# Patient Record
Sex: Female | Born: 1960 | Race: White | Hispanic: No | Smoking: Former smoker
Health system: Southern US, Community
[De-identification: ages and names within clinical notes are randomized; demographics above are authoritative.]

## PROBLEM LIST (undated history)

## (undated) ENCOUNTER — Emergency Department (HOSPITAL_BASED_OUTPATIENT_CLINIC_OR_DEPARTMENT_OTHER): Admission: EM | Discharge status: Against Medical Advice | Payer: Medicare Other

## (undated) DIAGNOSIS — F329 Major depressive disorder, single episode, unspecified: Secondary | ICD-10-CM

## (undated) DIAGNOSIS — J45909 Unspecified asthma, uncomplicated: Secondary | ICD-10-CM

## (undated) DIAGNOSIS — E119 Type 2 diabetes mellitus without complications: Secondary | ICD-10-CM

## (undated) DIAGNOSIS — J449 Chronic obstructive pulmonary disease, unspecified: Secondary | ICD-10-CM

## (undated) DIAGNOSIS — G894 Chronic pain syndrome: Secondary | ICD-10-CM

## (undated) DIAGNOSIS — F32A Depression, unspecified: Secondary | ICD-10-CM

## (undated) DIAGNOSIS — F1490 Cocaine use, unspecified, uncomplicated: Secondary | ICD-10-CM

## (undated) DIAGNOSIS — F419 Anxiety disorder, unspecified: Secondary | ICD-10-CM

## (undated) DIAGNOSIS — E079 Disorder of thyroid, unspecified: Secondary | ICD-10-CM

## (undated) DIAGNOSIS — I10 Essential (primary) hypertension: Secondary | ICD-10-CM

## (undated) DIAGNOSIS — K219 Gastro-esophageal reflux disease without esophagitis: Secondary | ICD-10-CM

## (undated) HISTORY — PX: KNEE SURGERY: SHX244

## (undated) HISTORY — PX: BACK SURGERY: SHX140

## (undated) HISTORY — PX: OTHER SURGICAL HISTORY: SHX169

## (undated) HISTORY — PX: EYE SURGERY: SHX253

---

## 2013-04-23 ENCOUNTER — Emergency Department (HOSPITAL_BASED_OUTPATIENT_CLINIC_OR_DEPARTMENT_OTHER)
Admission: EM | Admit: 2013-04-23 | Discharge: 2013-04-23 | Disposition: A | Discharge status: Home / Self Care | Payer: Medicare Other | Attending: Emergency Medicine | Admitting: Emergency Medicine

## 2013-04-23 ENCOUNTER — Encounter (HOSPITAL_BASED_OUTPATIENT_CLINIC_OR_DEPARTMENT_OTHER): Payer: Self-pay

## 2013-04-23 ENCOUNTER — Emergency Department (HOSPITAL_BASED_OUTPATIENT_CLINIC_OR_DEPARTMENT_OTHER): Payer: Medicare Other

## 2013-04-23 DIAGNOSIS — I10 Essential (primary) hypertension: Secondary | ICD-10-CM | POA: Insufficient documentation

## 2013-04-23 DIAGNOSIS — E079 Disorder of thyroid, unspecified: Secondary | ICD-10-CM | POA: Insufficient documentation

## 2013-04-23 DIAGNOSIS — J45901 Unspecified asthma with (acute) exacerbation: Secondary | ICD-10-CM | POA: Insufficient documentation

## 2013-04-23 DIAGNOSIS — IMO0002 Reserved for concepts with insufficient information to code with codable children: Secondary | ICD-10-CM | POA: Insufficient documentation

## 2013-04-23 DIAGNOSIS — Z79899 Other long term (current) drug therapy: Secondary | ICD-10-CM | POA: Insufficient documentation

## 2013-04-23 DIAGNOSIS — K219 Gastro-esophageal reflux disease without esophagitis: Secondary | ICD-10-CM | POA: Insufficient documentation

## 2013-04-23 DIAGNOSIS — F411 Generalized anxiety disorder: Secondary | ICD-10-CM | POA: Insufficient documentation

## 2013-04-23 DIAGNOSIS — J4521 Mild intermittent asthma with (acute) exacerbation: Secondary | ICD-10-CM

## 2013-04-23 HISTORY — DX: Essential (primary) hypertension: I10

## 2013-04-23 HISTORY — DX: Unspecified asthma, uncomplicated: J45.909

## 2013-04-23 HISTORY — DX: Gastro-esophageal reflux disease without esophagitis: K21.9

## 2013-04-23 HISTORY — DX: Anxiety disorder, unspecified: F41.9

## 2013-04-23 HISTORY — DX: Disorder of thyroid, unspecified: E07.9

## 2013-04-23 MED ORDER — ALBUTEROL SULFATE (5 MG/ML) 0.5% IN NEBU
5.0000 mg | INHALATION_SOLUTION | Freq: Once | RESPIRATORY_TRACT | Status: AC
  Administered 2013-04-23: 5 mg via RESPIRATORY_TRACT
  Filled 2013-04-23: qty 1

## 2013-04-23 MED ORDER — IPRATROPIUM BROMIDE 0.02 % IN SOLN
0.5000 mg | Freq: Once | RESPIRATORY_TRACT | Status: AC
  Administered 2013-04-23: 0.5 mg via RESPIRATORY_TRACT
  Filled 2013-04-23: qty 2.5

## 2013-04-23 MED ORDER — ALBUTEROL SULFATE (5 MG/ML) 0.5% IN NEBU
INHALATION_SOLUTION | RESPIRATORY_TRACT | Status: AC
  Administered 2013-04-23: 17:00:00
  Administered 2013-04-23: 2.5 mg
  Filled 2013-04-23: qty 1

## 2013-04-23 MED ORDER — PREDNISONE 20 MG PO TABS: 60.0000 mg | ORAL_TABLET | Freq: Every day | ORAL | Status: DC

## 2013-04-23 MED ORDER — PREDNISONE 50 MG PO TABS
60.0000 mg | ORAL_TABLET | Freq: Once | ORAL | Status: AC
  Administered 2013-04-23: 60 mg via ORAL
  Filled 2013-04-23: qty 1

## 2013-04-23 NOTE — ED Provider Notes (Signed)
History    CSN: [id number] Arrival date & time 04/23/13  1449 First MD Initiated Contact with Patient 04/23/13 1529      Chief Complaint  Patient presents with  . Cough   HPI Comments: The patient had a cough for at least the last week. She saw her primary doctor earlier in the week and was given a prescription for azithromycin. She finished that yesterday but does not feel any better. Can use to feel like she has mucus in her chest and can't quite get it up. She has not had any recent fevers.  Patient does have a history of asthma. She has been using her inhalers but that has not helped either. She does not smoke.   She denies leg swelling, sore throat or earache.  Patient is a 52 y.o. female presenting with cough. The history is provided by the patient.  Cough Cough characteristics:  Dry Severity:  Moderate Onset quality:  Gradual Timing:  Constant Chronicity:  New Smoker: no   Context: not sick contacts   Relieved by:  Nothing   Past Medical History  Diagnosis Date  . Hypertension   . Asthma   . GERD (gastroesophageal reflux disease)   . Anxiety   . Thyroid disease     Past Surgical History  Procedure Laterality Date  . Back surgery    . Knee surgery      No family history on file.  History  Substance Use Topics  . Smoking status: Never Smoker   . Smokeless tobacco: Current User    Types: Snuff  . Alcohol Use: No    OB History   Grav Para Term Preterm Abortions TAB SAB Ect Mult Living                  Review of Systems  Respiratory: Positive for cough.   All other systems reviewed and are negative.    Allergies  Sulfa antibiotics  Home Medications   Current Outpatient Rx  Name  Route  Sig  Dispense  Refill  . ALPRAZOLAM PO   Oral   Take by mouth.         Marland Kitchen amLODipine (NORVASC) 5 MG tablet   Oral   Take 5 mg by mouth daily.         . DULOXETINE HCL PO   Oral   Take by mouth.         Marland Kitchen HYDROcodone-acetaminophen (NORCO) 10-325 MG  per tablet   Oral   Take 1 tablet by mouth every 6 (six) hours as needed for pain.         Marland Kitchen LEVOTHYROXINE SODIUM PO   Oral   Take by mouth.         . meloxicam (MOBIC) 7.5 MG tablet   Oral   Take 7.5 mg by mouth daily.         Marland Kitchen METHOCARBAMOL PO   Oral   Take by mouth.         . OMEPRAZOLE PO   Oral   Take by mouth.         . OXYBUTYNIN CHLORIDE PO   Oral   Take by mouth.         . predniSONE (DELTASONE) 20 MG tablet   Oral   Take 3 tablets (60 mg total) by mouth daily.   15 tablet   0   . tiotropium (SPIRIVA) 18 MCG inhalation capsule   Inhalation   Place 18 mcg into inhaler and  inhale daily.         . TRAMADOL HCL PO   Oral   Take by mouth.           BP 155/107  Pulse 101  Temp(Src) 97.7 F (36.5 C) (Oral)  Resp 24  Ht 5' 7.5" (1.715 m)  Wt 304 lb (137.893 kg)  BMI 46.88 kg/m2  SpO2 98%  Physical Exam  Nursing note and vitals reviewed. Constitutional: She appears well-developed and well-nourished. No distress.  HENT:  Head: Normocephalic and atraumatic.  Right Ear: External ear normal.  Left Ear: External ear normal.  Eyes: Conjunctivae are normal. Right eye exhibits no discharge. Left eye exhibits no discharge. No scleral icterus.  Neck: Neck supple. No tracheal deviation present.  Cardiovascular: Normal rate, regular rhythm and intact distal pulses.   Pulmonary/Chest: Effort normal. No stridor. No respiratory distress. She has no decreased breath sounds. She has wheezes. She has no rales.  Abdominal: Soft. Bowel sounds are normal. She exhibits no distension. There is no tenderness. There is no rebound and no guarding.  Musculoskeletal: She exhibits no edema and no tenderness.  Neurological: She is alert. She has normal strength. No sensory deficit. Cranial nerve deficit:  no gross defecits noted. She exhibits normal muscle tone. She displays no seizure activity. Coordination normal.  Skin: Skin is warm and dry. No rash noted.   Psychiatric: She has a normal mood and affect.    ED Course  Procedures (including critical care time) Medications  predniSONE (DELTASONE) tablet 60 mg (60 mg Oral Given 04/23/13 1546)  albuterol (PROVENTIL) (5 MG/ML) 0.5% nebulizer solution 5 mg (5 mg Nebulization Given 04/23/13 1618)  ipratropium (ATROVENT) nebulizer solution 0.5 mg (0.5 mg Nebulization Given 04/23/13 1618)  albuterol (PROVENTIL) (5 MG/ML) 0.5% nebulizer solution (2.5 mg  Given 04/23/13 1630)    Labs Reviewed - No data to display Dg Chest 2 View  04/23/2013   *RADIOLOGY REPORT*  Clinical Data: Shortness of breath  CHEST - 2 VIEW  Comparison: None.  Findings: The heart and pulmonary vascularity are within normal limits.  Old healed rib fractures are noted bilaterally.  The lungs are clear. Postsurgical changes are noted in the left shoulder.  IMPRESSION: No acute abnormality noted.   Original Report Authenticated By: Alcide Clever, M.D.      MDM  The patient was given several albuterol treatments. Given oral dose of steroids. She is feeling better now after treatment. Spector symptoms are related to an asthma exacerbation. Her x-ray does not show evidence of pneumonia.       Celene Kras, MD 04/23/13 [phone]

## 2013-04-23 NOTE — ED Notes (Signed)
Cough, chest congestion x 7 days-was seen by PCP last Thursday-rx zithromax complete yesterday

## 2013-06-19 ENCOUNTER — Emergency Department (HOSPITAL_BASED_OUTPATIENT_CLINIC_OR_DEPARTMENT_OTHER)
Admission: EM | Admit: 2013-06-19 | Discharge: 2013-06-19 | Disposition: A | Discharge status: Home / Self Care | Payer: Medicare Other | Attending: Emergency Medicine | Admitting: Emergency Medicine

## 2013-06-19 ENCOUNTER — Encounter (HOSPITAL_BASED_OUTPATIENT_CLINIC_OR_DEPARTMENT_OTHER): Payer: Self-pay | Admitting: *Deleted

## 2013-06-19 DIAGNOSIS — Z79899 Other long term (current) drug therapy: Secondary | ICD-10-CM | POA: Insufficient documentation

## 2013-06-19 DIAGNOSIS — R52 Pain, unspecified: Secondary | ICD-10-CM | POA: Insufficient documentation

## 2013-06-19 DIAGNOSIS — IMO0002 Reserved for concepts with insufficient information to code with codable children: Secondary | ICD-10-CM | POA: Insufficient documentation

## 2013-06-19 DIAGNOSIS — J45909 Unspecified asthma, uncomplicated: Secondary | ICD-10-CM | POA: Insufficient documentation

## 2013-06-19 DIAGNOSIS — M25569 Pain in unspecified knee: Secondary | ICD-10-CM | POA: Insufficient documentation

## 2013-06-19 DIAGNOSIS — E079 Disorder of thyroid, unspecified: Secondary | ICD-10-CM | POA: Insufficient documentation

## 2013-06-19 DIAGNOSIS — G8929 Other chronic pain: Secondary | ICD-10-CM | POA: Insufficient documentation

## 2013-06-19 DIAGNOSIS — F411 Generalized anxiety disorder: Secondary | ICD-10-CM | POA: Insufficient documentation

## 2013-06-19 DIAGNOSIS — M25519 Pain in unspecified shoulder: Secondary | ICD-10-CM | POA: Insufficient documentation

## 2013-06-19 DIAGNOSIS — K219 Gastro-esophageal reflux disease without esophagitis: Secondary | ICD-10-CM | POA: Insufficient documentation

## 2013-06-19 DIAGNOSIS — I10 Essential (primary) hypertension: Secondary | ICD-10-CM | POA: Insufficient documentation

## 2013-06-19 MED ORDER — HYDROCODONE-ACETAMINOPHEN 10-325 MG PO TABS: 1.0000 | ORAL_TABLET | Freq: Four times a day (QID) | ORAL | Status: DC | PRN

## 2013-06-19 NOTE — ED Notes (Signed)
Pt states that she is out of her pain medication and was unable to get her refill on Monday.

## 2013-06-19 NOTE — ED Provider Notes (Signed)
CSN: [id number]     Arrival date & time 06/19/13  1404 History  This chart was scribed for Glynn Octave, MD by Bennett Scrape, ED Scribe. This patient was seen in room MH08/MH08 and the patient's care was started at 3:26 PM.   Chief Complaint  Patient presents with  . Medication Refill    The history is provided by the patient. No language interpreter was used.    HPI Comments: Martha Schwartz is a 52 y.o. female who presents to the Emergency Department requesting a refill on her 10-325 mg Norco that she takes 6 times daily. She had the prescription last filled on July 28th and states that she ran out 2 days ago. She is unable to get a refill until her new Medicaid card kicks in next month due to changing PCPs. She reports that she has been on the pain medication for the past 3 years for her chronic left knee pain and left shoulder pain. The knee pain is described as a stabbing pain that is worse after prolonged periods of standing and the left shoulder pain is described as throbbing. She admits to having prior surgeries to both the left shoulder and left knee for arthritis, last surgery was in 2007 on the left knee. She any recent falls and denies fevers or emesis.    PCP is Dr. Mayford Knife.    Past Medical History  Diagnosis Date  . Hypertension   . Asthma   . GERD (gastroesophageal reflux disease)   . Anxiety   . Thyroid disease    Past Surgical History  Procedure Laterality Date  . Back surgery    . Knee surgery     History reviewed. No pertinent family history. History  Substance Use Topics  . Smoking status: Never Smoker   . Smokeless tobacco: Current User    Types: Snuff  . Alcohol Use: No   No OB history provided.  Review of Systems  A complete 10 system review of systems was obtained and all systems are negative except as noted in the HPI and PMH.   Allergies  Sulfa antibiotics  Home Medications   Current Outpatient Rx  Name  Route  Sig  Dispense  Refill   . ALPRAZOLAM PO   Oral   Take by mouth.         Marland Kitchen amLODipine (NORVASC) 5 MG tablet   Oral   Take 10 mg by mouth daily.          . DULOXETINE HCL PO   Oral   Take by mouth.         . hydrochlorothiazide (HYDRODIURIL) 25 MG tablet   Oral   Take 25 mg by mouth daily.         Marland Kitchen HYDROcodone-acetaminophen (NORCO) 10-325 MG per tablet   Oral   Take 1 tablet by mouth every 6 (six) hours as needed for pain.         Marland Kitchen LEVOTHYROXINE SODIUM PO   Oral   Take by mouth.         . meloxicam (MOBIC) 7.5 MG tablet   Oral   Take 7.5 mg by mouth daily.         Marland Kitchen METHOCARBAMOL PO   Oral   Take by mouth.         . OMEPRAZOLE PO   Oral   Take by mouth.         . OXYBUTYNIN CHLORIDE PO   Oral   Take by mouth.         Marland Kitchen  predniSONE (DELTASONE) 20 MG tablet   Oral   Take 3 tablets (60 mg total) by mouth daily.   15 tablet   0   . tiotropium (SPIRIVA) 18 MCG inhalation capsule   Inhalation   Place 18 mcg into inhaler and inhale daily.         . TRAMADOL HCL PO   Oral   Take by mouth.         Marland Kitchen HYDROcodone-acetaminophen (NORCO) 10-325 MG per tablet   Oral   Take 1 tablet by mouth every 6 (six) hours as needed for pain.   20 tablet   0    BP 126/84  Pulse 76  Temp(Src) 98.1 F (36.7 C) (Oral)  Resp 16  SpO2 96%  LMP 12/10/2012  Physical Exam  Nursing note and vitals reviewed. Constitutional: She is oriented to person, place, and time. She appears well-developed and well-nourished. No distress.  HENT:  Head: Normocephalic and atraumatic.  Mouth/Throat: Oropharynx is clear and moist.  Eyes: Conjunctivae and EOM are normal. Pupils are equal, round, and reactive to light.  Neck: Neck supple. No tracheal deviation present.  Cardiovascular: Normal rate and regular rhythm.   Pulmonary/Chest: Effort normal and breath sounds normal. No respiratory distress.  Abdominal: Soft. There is no tenderness.  Musculoskeletal: Normal range of motion. She  exhibits no edema.  No pain with ROM of the left knee, no effusion, able to flex and extend the left knee, intact DP and PT pulse, no pain with ROM of the left shoulder, no effusion, no tenderness to palpation of the shoulder, intact radial pulse   Neurological: She is alert and oriented to person, place, and time.   5/5 strength throughout   Skin: Skin is warm and dry.  Psychiatric: She has a normal mood and affect. Her behavior is normal.    ED Course   DIAGNOSTIC STUDIES: None performed   COORDINATION OF CARE: 3:27 PM-Informed pt that the ED is not an appropriate place for medications to be refilled. Informed pt that she will not receive any narcotics before discharge due to her driving herself. Discussed discharge plan with pt and pt agreed to plan. Also advised pt to follow up with PCP and pt agreed.   Procedures (including critical care time)  Labs Reviewed - No data to display No results found. 1. Chronic pain     MDM  Acute on chronic knee and shoulder pain.  Hx arthritis, pain for years.  No change in chronic characterization. No weakness, numbness, tingling. No fever or vomiting.   90 norco filled on 06/03/13 by Dr. Mayford Knife.  Patient states she cannot refill this until she gets her new medicaid card.   Explained to the patient that the ER is not inappropriate venue for her chronic pain needs. There is no evidence of septic joint or other orthopedic emergency on exam today. She is given a short course of pain medications until she can see her doctor.  I personally performed the services described in this documentation, which was scribed in my presence. The recorded information has been reviewed and is accurate.    Glynn Octave, MD 06/19/13 [phone]

## 2013-06-19 NOTE — ED Notes (Signed)
MD at bedside. 

## 2013-06-21 ENCOUNTER — Encounter: Payer: Self-pay | Admitting: Internal Medicine

## 2013-06-21 ENCOUNTER — Ambulatory Visit (INDEPENDENT_AMBULATORY_CARE_PROVIDER_SITE_OTHER): Payer: Medicare Other | Admitting: Internal Medicine

## 2013-06-21 VITALS — BP 132/76 | HR 95 | Ht 67.0 in | Wt 309.8 lb

## 2013-06-21 DIAGNOSIS — R000 Tachycardia, unspecified: Secondary | ICD-10-CM

## 2013-06-21 DIAGNOSIS — R0989 Other specified symptoms and signs involving the circulatory and respiratory systems: Secondary | ICD-10-CM

## 2013-06-21 DIAGNOSIS — R0609 Other forms of dyspnea: Secondary | ICD-10-CM

## 2013-06-21 DIAGNOSIS — G8929 Other chronic pain: Secondary | ICD-10-CM

## 2013-06-21 DIAGNOSIS — K219 Gastro-esophageal reflux disease without esophagitis: Secondary | ICD-10-CM

## 2013-06-21 DIAGNOSIS — I10 Essential (primary) hypertension: Secondary | ICD-10-CM

## 2013-06-21 DIAGNOSIS — F1011 Alcohol abuse, in remission: Secondary | ICD-10-CM

## 2013-06-21 DIAGNOSIS — F1191 Opioid use, unspecified, in remission: Secondary | ICD-10-CM

## 2013-06-21 DIAGNOSIS — F1311 Sedative, hypnotic or anxiolytic abuse, in remission: Secondary | ICD-10-CM

## 2013-06-21 DIAGNOSIS — Z87898 Personal history of other specified conditions: Secondary | ICD-10-CM

## 2013-06-21 DIAGNOSIS — E039 Hypothyroidism, unspecified: Secondary | ICD-10-CM

## 2013-06-21 DIAGNOSIS — G4710 Hypersomnia, unspecified: Secondary | ICD-10-CM

## 2013-06-21 MED ORDER — METOPROLOL SUCCINATE ER 25 MG PO TB24: 25.0000 mg | ORAL_TABLET | Freq: Every day | ORAL | Status: DC

## 2013-06-21 NOTE — Patient Instructions (Addendum)
Your physician has recommended that you have a sleep study. This test records several body functions during sleep, including: brain activity, eye movement, oxygen and carbon dioxide blood levels, heart rate and rhythm, breathing rate and rhythm, the flow of air through your mouth and nose, snoring, body muscle movements, and chest and belly movement.  Your physician has recommended you make the following change in your medication: START taking Toprol XL 25mg  daily.   Your physician wants you to follow-up in: 4-6 weeks. You will receive a reminder letter in the mail two months in advance. If you don't receive a letter, please call our office to schedule the follow-up appointment.

## 2013-06-22 ENCOUNTER — Encounter: Payer: Self-pay | Admitting: Internal Medicine

## 2013-06-22 DIAGNOSIS — Z87898 Personal history of other specified conditions: Secondary | ICD-10-CM | POA: Insufficient documentation

## 2013-06-22 DIAGNOSIS — I10 Essential (primary) hypertension: Secondary | ICD-10-CM | POA: Insufficient documentation

## 2013-06-22 DIAGNOSIS — E039 Hypothyroidism, unspecified: Secondary | ICD-10-CM | POA: Insufficient documentation

## 2013-06-22 DIAGNOSIS — K219 Gastro-esophageal reflux disease without esophagitis: Secondary | ICD-10-CM | POA: Insufficient documentation

## 2013-06-22 DIAGNOSIS — G8929 Other chronic pain: Secondary | ICD-10-CM | POA: Insufficient documentation

## 2013-06-22 DIAGNOSIS — R000 Tachycardia, unspecified: Secondary | ICD-10-CM | POA: Insufficient documentation

## 2013-06-22 DIAGNOSIS — F1191 Opioid use, unspecified, in remission: Secondary | ICD-10-CM | POA: Insufficient documentation

## 2013-06-22 DIAGNOSIS — F1011 Alcohol abuse, in remission: Secondary | ICD-10-CM | POA: Insufficient documentation

## 2013-06-22 NOTE — Progress Notes (Signed)
OFFICE NOTE  Chief Complaint:  Tachycardia, hypertension  Primary Care Physician: Martha Liner, MD  HPI:  Martha Schwartz is a 52 year old female with numerous medical problems including morbid obesity, history of alcohol abuse in the past and long-standing narcotic medication use for chronic pain including back pain knee pain and joint pain. She was recently seen in Dr. Mayford Schwartz office was found to be hypertensive. He increased her medications and she does have notable tachycardia. She is referred for evaluation of her tachycardia. Reports her heart rate is always fairly fast, mostly when she is in pain. Denies any chest pain really get short of breath only with marked exertion. Other than hypertension seen on any other significant coronary risk factors besides morbid obesity. She takes Cymbalta for chronic pain and unknown mood disorder.  PMHx:  Past Medical History  Diagnosis Date  . Hypertension   . Asthma   . GERD (gastroesophageal reflux disease)   . Anxiety   . Thyroid disease     Past Surgical History  Procedure Laterality Date  . Back surgery    . Knee surgery      FAMHx:  No family history on file.  SOCHx:   reports that she has quit smoking. Her smoking use included Cigarettes. She smoked 0.00 packs per day. Her smokeless tobacco use includes Snuff. She reports that she does not drink alcohol or use illicit drugs.  ALLERGIES:  Allergies  Allergen Reactions  . Sulfa Antibiotics Rash    ROS: A comprehensive review of systems was negative except for: Constitutional: positive for fatigue Respiratory: positive for dyspnea on exertion Cardiovascular: positive for htn Musculoskeletal: positive for back pain and myalgias Behavioral/Psych: positive for anxiety  HOME MEDS: Current Outpatient Prescriptions  Medication Sig Dispense Refill  . ALBUTEROL IN Inhale into the lungs as needed.      . ALPRAZOLAM PO Take 1 mg by mouth 2 (two) times daily.       Marland Kitchen  amLODipine (NORVASC) 5 MG tablet Take 10 mg by mouth daily.       Marland Kitchen Dextromethorphan-Guaifenesin (GUAIFENESIN DM) 400-20 MG TABS Take by mouth as needed.      . docusate sodium (COLACE) 100 MG capsule Take 100 mg by mouth 2 (two) times daily.      . DULOXETINE HCL PO Take 120 mg by mouth daily.       Marland Kitchen HYDROcodone-acetaminophen (NORCO) 10-325 MG per tablet Take 1 tablet by mouth every 6 (six) hours as needed for pain.  20 tablet  0  . hydrocortisone 2.5 % cream Apply 1 application topically daily.      Marland Kitchen ibuprofen (ADVIL,MOTRIN) 200 MG tablet Take 200 mg by mouth every 6 (six) hours as needed for pain.      Marland Kitchen LEVOTHYROXINE SODIUM PO Take 50 mg by mouth daily.       Marland Kitchen lisinopril-hydrochlorothiazide (PRINZIDE,ZESTORETIC) 20-25 MG per tablet Take 1 tablet by mouth daily.      . meloxicam (MOBIC) 7.5 MG tablet Take 7.5 mg by mouth daily.      Marland Kitchen METHOCARBAMOL PO Take 750-1,500 mg by mouth 2 (two) times daily.       Marland Kitchen OMEPRAZOLE PO Take 20 mg by mouth daily.       . OXYBUTYNIN CHLORIDE PO Take 5 mg by mouth.       . tiotropium (SPIRIVA) 18 MCG inhalation capsule Place 18 mcg into inhaler and inhale daily.      Marland Kitchen tobramycin-dexamethasone (TOBRADEX) ophthalmic solution Place 1 drop into  both eyes every 4 (four) hours while awake.      Marland Kitchen TRAMADOL HCL PO Take 50 mg by mouth every 8 (eight) hours.       . metoprolol succinate (TOPROL XL) 25 MG 24 hr tablet Take 1 tablet (25 mg total) by mouth daily.  30 tablet  11   No current facility-administered medications for this visit.    LABS/IMAGING: No results found for this or any previous visit (from the past 48 hour(s)). No results found.  VITALS: BP 132/76  Pulse 95  Ht 5\' 7"  (1.702 m)  Wt 309 lb 12.8 oz (140.524 kg)  BMI 48.51 kg/m2  LMP 12/10/2012  EXAM: General appearance: alert and no distress Neck: no adenopathy, no carotid bruit, no JVD, supple, symmetrical, trachea midline and thyroid not enlarged, symmetric, no  tenderness/mass/nodules Lungs: clear to auscultation bilaterally Heart: regular rate and rhythm, S1, S2 normal, no murmur, click, rub or gallop Abdomen: soft, non-tender; bowel sounds normal; no masses,  no organomegaly Extremities: extremities normal, atraumatic, no cyanosis or edema Pulses: 2+ and symmetric Skin: Skin color, texture, turgor normal. No rashes or lesions Neurologic: Grossly normal  EKG: Normal sinus rhythm at 95  ASSESSMENT: 1. Hypertension-controlled 2. Tachycardia 3. Morbid obesity 4. Chronic pain probably contributing to some of her tachycardia as well as her obesity  PLAN: 1.   Martha Schwartz has better blood pressure control on her current regimen. I think she would benefit from a beta blocker to help with her tachycardia and to help with her shortness of breath with exertion. She would also benefit from exercise and weight loss and so those are mainly contributing to most of her symptoms. On his in this may be difficult to do due to her chronic pain and narcotic use. She's not had worsening symptoms of angina at this time and has some risk factors but nothing particularly high risk. I don't feel that any stress testing is necessary at this time. We'll simply see her she does on her new beta blocker.  Martha Nose, MD, [facility] Attending Cardiologist [facility]  Martha Schwartz 06/22/2013, 2:35 PM

## 2013-06-27 ENCOUNTER — Telehealth: Payer: Self-pay | Admitting: Internal Medicine

## 2013-06-27 NOTE — Telephone Encounter (Signed)
Patient saw Dr Rennis Golden the other day and was given Metoprolol lER Succ 25mg .  Can she take this at bedtime---this medication makes her sleepy.

## 2013-06-27 NOTE — Telephone Encounter (Signed)
Returned call.  Pt informed message received and that she can try taking metoprolol at night for a few days to see if that helps.  Advised she call back if no change.  Pt verbalized understanding and agreed w/ plan.

## 2013-07-29 ENCOUNTER — Ambulatory Visit (HOSPITAL_BASED_OUTPATIENT_CLINIC_OR_DEPARTMENT_OTHER): Payer: Medicare Other | Attending: Internal Medicine | Admitting: Radiology

## 2013-07-29 VITALS — Ht 67.0 in | Wt 309.0 lb

## 2013-07-29 DIAGNOSIS — G4710 Hypersomnia, unspecified: Secondary | ICD-10-CM

## 2013-07-29 DIAGNOSIS — R0989 Other specified symptoms and signs involving the circulatory and respiratory systems: Secondary | ICD-10-CM

## 2013-07-29 DIAGNOSIS — G4700 Insomnia, unspecified: Secondary | ICD-10-CM | POA: Insufficient documentation

## 2013-07-29 DIAGNOSIS — R0609 Other forms of dyspnea: Secondary | ICD-10-CM

## 2013-07-29 DIAGNOSIS — G4733 Obstructive sleep apnea (adult) (pediatric): Secondary | ICD-10-CM

## 2013-08-04 DIAGNOSIS — G4733 Obstructive sleep apnea (adult) (pediatric): Secondary | ICD-10-CM

## 2013-08-05 ENCOUNTER — Ambulatory Visit: Payer: Medicare Other | Admitting: Internal Medicine

## 2013-08-05 NOTE — Procedures (Signed)
NAMECECILEE, Martha Schwartz               ACCOUNT NO.:  [id number]  MEDICAL RECORD NO.:  [id number]          PATIENT TYPE:  OUT  LOCATION:  SLEEP CENTER                 FACILITY:  [facility]  PHYSICIAN:  Elexius Minar D. Maple Hudson, MD, FCCP, FACPDATE OF BIRTH:  1962-[birth year]-20  DATE OF STUDY:  07/29/2013                           NOCTURNAL POLYSOMNOGRAM  REFERRING PHYSICIAN:  Italy Hilty, MD  INDICATION FOR STUDY:  Insomnia with sleep apnea.  EPWORTH SLEEPINESS SCORE:  7/24.  BMI 48, weight 309 pounds, height 67 inches, neck 18 inches.  MEDICATIONS:  Home medications are charted for review.  SLEEP ARCHITECTURE:  Total sleep time 308.5 minutes with sleep efficiency 87.1%.  Stage I was 6.8%, stage II 90.8%, stage III 2.4%, REM absent.  Sleep latency 26 minutes.  Awake after sleep onset 17.5 minutes.  Arousal index 15.  BEDTIME MEDICATION:  Methocarbamol, amlodipine.  RESPIRATORY DATA:  Apnea-hypopnea index (AHI) 1.9 per hour.  A total of 10 events was scored, all with hypopneas while sleeping supine.  There were not enough events to apply split protocol CPAP titration.  OXYGEN DATA:  Moderately loud snoring with oxygen desaturation to a nadir of 92% and mean oxygen saturation through the study of 95.6% on room air.  CARDIAC DATA:  Sinus rhythm with frequent PVCs.  MOVEMENT-PARASOMNIA:  No significant movement disturbance.  Bathroom x1.  IMPRESSIONS-RECOMMENDATIONS: 1. Occasional respiratory events with sleep disturbance, within normal     limits.  AHI 1.9 per hour (the normal range for adults is an AHI     between 0 and 5 events per hour).  Moderately loud snoring with     oxygen desaturation to a nadir of 92% and mean oxygen saturation     through the study of 95.6% on room air. 2. The patient attributed her sleep problems to pain causing sleep     disturbance.    Marica Trentham D. Maple Hudson, MD, [facility], FACP Diplomate, American Board of Sleep Medicine   CDY/MEDQ  D:  08/04/2013 12:58:17  T:  08/05/2013  04:02:05  Job:  [id number]

## 2013-08-08 ENCOUNTER — Telehealth: Payer: Self-pay | Admitting: *Deleted

## 2013-08-08 NOTE — Telephone Encounter (Signed)
Called patient with sleep study results per Dr. Rennis Golden (negative for sleep apnea). Patient verbalized understanding.

## 2014-06-14 ENCOUNTER — Other Ambulatory Visit: Payer: Self-pay | Admitting: Internal Medicine

## 2014-06-16 NOTE — Telephone Encounter (Signed)
Rx refill sent to patient pharmacy   

## 2014-10-28 ENCOUNTER — Ambulatory Visit: Payer: Medicare Other

## 2014-11-12 ENCOUNTER — Ambulatory Visit: Payer: Medicare Other | Admitting: Rehabilitation

## 2014-11-18 ENCOUNTER — Ambulatory Visit: Payer: Medicare Other

## 2014-12-01 ENCOUNTER — Ambulatory Visit: Payer: Medicare Other

## 2014-12-09 ENCOUNTER — Ambulatory Visit: Payer: Medicare Other

## 2014-12-15 ENCOUNTER — Ambulatory Visit: Payer: Medicare Other | Attending: Orthopaedic Surgery

## 2015-01-25 ENCOUNTER — Emergency Department (HOSPITAL_BASED_OUTPATIENT_CLINIC_OR_DEPARTMENT_OTHER)
Admission: EM | Admit: 2015-01-25 | Discharge: 2015-01-25 | Disposition: A | Discharge status: Home / Self Care | Payer: Medicare Other | Attending: Emergency Medicine | Admitting: Emergency Medicine

## 2015-01-25 DIAGNOSIS — E079 Disorder of thyroid, unspecified: Secondary | ICD-10-CM | POA: Insufficient documentation

## 2015-01-25 DIAGNOSIS — Z9889 Other specified postprocedural states: Secondary | ICD-10-CM | POA: Insufficient documentation

## 2015-01-25 DIAGNOSIS — M549 Dorsalgia, unspecified: Secondary | ICD-10-CM

## 2015-01-25 DIAGNOSIS — K219 Gastro-esophageal reflux disease without esophagitis: Secondary | ICD-10-CM | POA: Diagnosis not present

## 2015-01-25 DIAGNOSIS — G8929 Other chronic pain: Secondary | ICD-10-CM | POA: Diagnosis not present

## 2015-01-25 DIAGNOSIS — Z791 Long term (current) use of non-steroidal anti-inflammatories (NSAID): Secondary | ICD-10-CM | POA: Insufficient documentation

## 2015-01-25 DIAGNOSIS — Z7952 Long term (current) use of systemic steroids: Secondary | ICD-10-CM | POA: Diagnosis not present

## 2015-01-25 DIAGNOSIS — J45909 Unspecified asthma, uncomplicated: Secondary | ICD-10-CM | POA: Diagnosis not present

## 2015-01-25 DIAGNOSIS — Z79899 Other long term (current) drug therapy: Secondary | ICD-10-CM | POA: Diagnosis not present

## 2015-01-25 DIAGNOSIS — Z87891 Personal history of nicotine dependence: Secondary | ICD-10-CM | POA: Insufficient documentation

## 2015-01-25 DIAGNOSIS — I10 Essential (primary) hypertension: Secondary | ICD-10-CM | POA: Insufficient documentation

## 2015-01-25 DIAGNOSIS — F419 Anxiety disorder, unspecified: Secondary | ICD-10-CM | POA: Diagnosis not present

## 2015-01-25 DIAGNOSIS — M545 Low back pain: Secondary | ICD-10-CM | POA: Diagnosis not present

## 2015-01-25 LAB — CBG MONITORING, ED: GLUCOSE-CAPILLARY: 123 mg/dL — AB (ref 70–99)

## 2015-01-25 MED ORDER — PREDNISONE 50 MG PO TABS
60.0000 mg | ORAL_TABLET | Freq: Once | ORAL | Status: AC
  Administered 2015-01-25: 60 mg via ORAL
  Filled 2015-01-25 (×2): qty 1

## 2015-01-25 MED ORDER — PREDNISONE 10 MG PO TABS: 20.0000 mg | ORAL_TABLET | Freq: Every day | ORAL | Status: DC

## 2015-01-25 MED ORDER — PREDNISONE 10 MG PO TABS: 20.0000 mg | ORAL_TABLET | Freq: Every day | ORAL | Status: AC

## 2015-01-25 NOTE — ED Notes (Addendum)
Pt states back pain, has taken meloxicam and ibuprofen with minimal relief. Pt has hx of discs removed from lower back and has continuous pain. Pt has Oxycodone 15mg  and does not relieve pain.  States she needs an inflammatory shot into back to relieve pain.

## 2015-01-25 NOTE — ED Provider Notes (Signed)
CSN: [id number]     Arrival date & time 01/25/15  1624 History  This chart was scribed for Pattricia Boss, MD by Chester Holstein, ED Scribe. This patient was seen in room MH03/MH03 and the patient's care was started at 6:22 PM.    Chief Complaint  Patient presents with  . Back Pain     Patient is a 54 y.o. female presenting with back pain. The history is provided by the patient. No language interpreter was used.  Back Pain Associated symptoms: no numbness and no weakness    HPI Comments: Martha Schwartz is a 54 y.o. female with PMHx of HTN and DM who presents to the Emergency Department complaining of worsening back pain with onset 3 days ago. Pt with h/o chronic back pain and disc removal and notes flare up.  Pt able to ambulate per baseline. Pt reports "I think I won't be able to walk." Pt denies fall or known injury. Pt took ibuprofen, oxycodone, and meloxicam for relief. Pt states she has gotten an inflammatory shot into back in past. Pt denies any new numbness or weakness and  anyother complaints at this time.   Past Medical History  Diagnosis Date  . Hypertension   . Asthma   . GERD (gastroesophageal reflux disease)   . Anxiety   . Thyroid disease    Past Surgical History  Procedure Laterality Date  . Back surgery    . Knee surgery     No family history on file. History  Substance Use Topics  . Smoking status: Former Smoker    Types: Cigarettes  . Smokeless tobacco: Current User    Types: Snuff  . Alcohol Use: No   OB History    No data available     Review of Systems  Musculoskeletal: Positive for myalgias and back pain. Negative for neck pain.  Neurological: Negative for weakness and numbness.      Allergies  Sulfa antibiotics  Home Medications   Prior to Admission medications   Medication Sig Start Date End Date Taking? Authorizing Provider  ALBUTEROL IN Inhale into the lungs as needed.    Historical Provider, MD  ALPRAZOLAM PO Take 1 mg by mouth 2 (two)  times daily.     Historical Provider, MD  amLODipine (NORVASC) 5 MG tablet Take 10 mg by mouth daily.     Historical Provider, MD  Dextromethorphan-Guaifenesin (GUAIFENESIN DM) 400-20 MG TABS Take by mouth as needed.    Historical Provider, MD  docusate sodium (COLACE) 100 MG capsule Take 100 mg by mouth 2 (two) times daily.    Historical Provider, MD  DULOXETINE HCL PO Take 120 mg by mouth daily.     Historical Provider, MD  HYDROcodone-acetaminophen (NORCO) 10-325 MG per tablet Take 1 tablet by mouth every 6 (six) hours as needed for pain. 06/19/13   Ezequiel Essex, MD  hydrocortisone 2.5 % cream Apply 1 application topically daily.    Historical Provider, MD  ibuprofen (ADVIL,MOTRIN) 200 MG tablet Take 200 mg by mouth every 6 (six) hours as needed for pain.    Historical Provider, MD  LEVOTHYROXINE SODIUM PO Take 50 mg by mouth daily.     Historical Provider, MD  lisinopril-hydrochlorothiazide (PRINZIDE,ZESTORETIC) 20-25 MG per tablet Take 1 tablet by mouth daily.    Historical Provider, MD  meloxicam (MOBIC) 7.5 MG tablet Take 7.5 mg by mouth daily.    Historical Provider, MD  METHOCARBAMOL PO Take 750-1,500 mg by mouth 2 (two) times daily.  Historical Provider, MD  metoprolol succinate (TOPROL-XL) 25 MG 24 hr tablet Take 1 tablet (25 mg total) by mouth daily. *Patient needs appointment for further refills*    Pixie Casino, MD  OMEPRAZOLE PO Take 20 mg by mouth daily.     Historical Provider, MD  OXYBUTYNIN CHLORIDE PO Take 5 mg by mouth.     Historical Provider, MD  tiotropium (SPIRIVA) 18 MCG inhalation capsule Place 18 mcg into inhaler and inhale daily.    Historical Provider, MD  tobramycin-dexamethasone [facility]) ophthalmic solution Place 1 drop into both eyes every 4 (four) hours while awake.    Historical Provider, MD  TRAMADOL HCL PO Take 50 mg by mouth every 8 (eight) hours.     Historical Provider, MD   BP 100/76 mmHg  Pulse 112  Temp(Src) 97.8 F (36.6 C) (Oral)  Resp 24   Ht 5\' 7"  (1.702 m)  Wt 319 lb (144.697 kg)  BMI 49.95 kg/m2  SpO2 97% Physical Exam  Constitutional: She is oriented to person, place, and time. She appears well-developed and well-nourished.  HENT:  Head: Normocephalic.  Eyes: Conjunctivae are normal.  Neck: Normal range of motion. Neck supple.  Cardiovascular: Intact distal pulses.   Pulmonary/Chest: Effort normal.  Musculoskeletal: Normal range of motion.       Cervical back: She exhibits no swelling.       Thoracic back: She exhibits no swelling.       Lumbar back: She exhibits tenderness. She exhibits no swelling.  Neurological: She is alert and oriented to person, place, and time. Coordination and gait normal.  Strength intact and equal bilaterally; Sensation intact; reflexes intact and equal bialterally  Skin: Skin is warm and dry.  Psychiatric: She has a normal mood and affect. Her behavior is normal.  Nursing note and vitals reviewed.   ED Course  Procedures (including critical care time) DIAGNOSTIC STUDIES: Oxygen Saturation is 97% on room air, normal by my interpretation.    COORDINATION OF CARE: 6:40 PM Discussed treatment plan with patient at beside including  Rx for prednisone, the patient agrees with the plan and has no further questions at this time.     MDM   Final diagnoses:  Chronic back pain    I personally performed the services described in this documentation, which was scribed in my presence. The recorded information has been reviewed and considered.   Pattricia Boss, MD 01/28/15 [id number]

## 2015-01-25 NOTE — Discharge Instructions (Signed)

## 2015-02-18 ENCOUNTER — Telehealth: Payer: Self-pay | Admitting: Internal Medicine

## 2015-02-20 NOTE — Telephone Encounter (Signed)
Close encounter 

## 2015-03-05 ENCOUNTER — Ambulatory Visit: Payer: Medicare Other | Admitting: Cardiology

## 2015-03-31 ENCOUNTER — Ambulatory Visit: Payer: Medicare Other | Admitting: Cardiology

## 2015-10-07 ENCOUNTER — Encounter (HOSPITAL_BASED_OUTPATIENT_CLINIC_OR_DEPARTMENT_OTHER): Payer: Self-pay

## 2015-10-07 ENCOUNTER — Inpatient Hospital Stay (HOSPITAL_BASED_OUTPATIENT_CLINIC_OR_DEPARTMENT_OTHER)
Admission: EM | Admit: 2015-10-07 | Discharge: 2015-10-11 | DRG: 760 | Disposition: A | Discharge status: Home / Self Care | Payer: Medicare Other | Attending: Obstetrics & Gynecology | Admitting: Obstetrics & Gynecology

## 2015-10-07 ENCOUNTER — Emergency Department (HOSPITAL_BASED_OUTPATIENT_CLINIC_OR_DEPARTMENT_OTHER): Payer: Medicare Other

## 2015-10-07 DIAGNOSIS — K219 Gastro-esophageal reflux disease without esophagitis: Secondary | ICD-10-CM | POA: Diagnosis present

## 2015-10-07 DIAGNOSIS — N839 Noninflammatory disorder of ovary, fallopian tube and broad ligament, unspecified: Secondary | ICD-10-CM | POA: Diagnosis not present

## 2015-10-07 DIAGNOSIS — N949 Unspecified condition associated with female genital organs and menstrual cycle: Secondary | ICD-10-CM | POA: Diagnosis present

## 2015-10-07 DIAGNOSIS — E119 Type 2 diabetes mellitus without complications: Secondary | ICD-10-CM | POA: Diagnosis present

## 2015-10-07 DIAGNOSIS — R109 Unspecified abdominal pain: Secondary | ICD-10-CM

## 2015-10-07 DIAGNOSIS — N9489 Other specified conditions associated with female genital organs and menstrual cycle: Secondary | ICD-10-CM

## 2015-10-07 DIAGNOSIS — Z720 Tobacco use: Secondary | ICD-10-CM

## 2015-10-07 DIAGNOSIS — I119 Hypertensive heart disease without heart failure: Secondary | ICD-10-CM | POA: Diagnosis present

## 2015-10-07 DIAGNOSIS — G8929 Other chronic pain: Secondary | ICD-10-CM | POA: Diagnosis present

## 2015-10-07 DIAGNOSIS — J45909 Unspecified asthma, uncomplicated: Secondary | ICD-10-CM | POA: Diagnosis present

## 2015-10-07 DIAGNOSIS — R1900 Intra-abdominal and pelvic swelling, mass and lump, unspecified site: Secondary | ICD-10-CM | POA: Diagnosis present

## 2015-10-07 DIAGNOSIS — F1120 Opioid dependence, uncomplicated: Secondary | ICD-10-CM | POA: Diagnosis present

## 2015-10-07 DIAGNOSIS — N83202 Unspecified ovarian cyst, left side: Principal | ICD-10-CM | POA: Diagnosis present

## 2015-10-07 DIAGNOSIS — Z6841 Body Mass Index (BMI) 40.0 and over, adult: Secondary | ICD-10-CM

## 2015-10-07 DIAGNOSIS — E039 Hypothyroidism, unspecified: Secondary | ICD-10-CM | POA: Diagnosis present

## 2015-10-07 DIAGNOSIS — J449 Chronic obstructive pulmonary disease, unspecified: Secondary | ICD-10-CM | POA: Diagnosis present

## 2015-10-07 DIAGNOSIS — R102 Pelvic and perineal pain: Secondary | ICD-10-CM

## 2015-10-07 DIAGNOSIS — N838 Other noninflammatory disorders of ovary, fallopian tube and broad ligament: Secondary | ICD-10-CM | POA: Diagnosis present

## 2015-10-07 HISTORY — DX: Depression, unspecified: F32.A

## 2015-10-07 HISTORY — DX: Type 2 diabetes mellitus without complications: E11.9

## 2015-10-07 HISTORY — DX: Cocaine use, unspecified, uncomplicated: F14.90

## 2015-10-07 HISTORY — DX: Chronic pain syndrome: G89.4

## 2015-10-07 HISTORY — DX: Chronic obstructive pulmonary disease, unspecified: J44.9

## 2015-10-07 HISTORY — DX: Major depressive disorder, single episode, unspecified: F32.9

## 2015-10-07 LAB — DIFFERENTIAL
BASOS ABS: 0.1 10*3/uL (ref 0.0–0.1)
BASOS PCT: 1 %
Eosinophils Absolute: 0.2 10*3/uL (ref 0.0–0.7)
Eosinophils Relative: 2 %
Lymphocytes Relative: 17 %
Lymphs Abs: 1.5 10*3/uL (ref 0.7–4.0)
MONO ABS: 0.6 10*3/uL (ref 0.1–1.0)
Monocytes Relative: 7 %
NEUTROS ABS: 6.6 10*3/uL (ref 1.7–7.7)
Neutrophils Relative %: 73 %

## 2015-10-07 LAB — COMPREHENSIVE METABOLIC PANEL
ALBUMIN: 3.7 g/dL (ref 3.5–5.0)
ALK PHOS: 80 U/L (ref 38–126)
ALT: 33 U/L (ref 14–54)
AST: 36 U/L (ref 15–41)
Anion gap: 9 (ref 5–15)
BILIRUBIN TOTAL: 0.7 mg/dL (ref 0.3–1.2)
BUN: 15 mg/dL (ref 6–20)
CALCIUM: 9.5 mg/dL (ref 8.9–10.3)
CO2: 25 mmol/L (ref 22–32)
CREATININE: 0.75 mg/dL (ref 0.44–1.00)
Chloride: 103 mmol/L (ref 101–111)
GFR calc Af Amer: >60 mL/min (ref >60)
GLUCOSE: 164 mg/dL — AB (ref 65–99)
Potassium: 3.7 mmol/L (ref 3.5–5.1)
Sodium: 137 mmol/L (ref 135–145)
TOTAL PROTEIN: 7.1 g/dL (ref 6.5–8.1)

## 2015-10-07 LAB — CBC
HEMATOCRIT: 38.7 % (ref 36.0–46.0)
Hemoglobin: 12.8 g/dL (ref 12.0–15.0)
MCH: 29.6 pg (ref 26.0–34.0)
MCHC: 33.1 g/dL (ref 30.0–36.0)
MCV: 89.4 fL (ref 78.0–100.0)
PLATELETS: 253 10*3/uL (ref 150–400)
RBC: 4.33 MIL/uL (ref 3.87–5.11)
RDW: 13.5 % (ref 11.5–15.5)
WBC: 8.9 10*3/uL (ref 4.0–10.5)

## 2015-10-07 LAB — I-STAT CG4 LACTIC ACID, ED: Lactic Acid, Venous: 2.12 mmol/L (ref 0.5–2.0)

## 2015-10-07 LAB — PREGNANCY, URINE: PREG TEST UR: NEGATIVE

## 2015-10-07 LAB — URINALYSIS, ROUTINE W REFLEX MICROSCOPIC
Bilirubin Urine: NEGATIVE
GLUCOSE, UA: NEGATIVE mg/dL
Hgb urine dipstick: NEGATIVE
KETONES UR: NEGATIVE mg/dL
Nitrite: NEGATIVE
PROTEIN: NEGATIVE mg/dL
Specific Gravity, Urine: 1.018 (ref 1.005–1.030)
pH: 8 (ref 5.0–8.0)

## 2015-10-07 LAB — URINE MICROSCOPIC-ADD ON

## 2015-10-07 LAB — LIPASE, BLOOD: LIPASE: 21 U/L (ref 11–51)

## 2015-10-07 MED ORDER — HYDROMORPHONE HCL 1 MG/ML IJ SOLN
1.0000 mg | Freq: Once | INTRAMUSCULAR | Status: AC
Start: 2015-10-07 — End: 2015-10-07
  Administered 2015-10-07: 1 mg via INTRAVENOUS
  Filled 2015-10-07: qty 1

## 2015-10-07 MED ORDER — ONDANSETRON HCL 4 MG/2ML IJ SOLN: 4.0000 mg | Freq: Three times a day (TID) | INTRAMUSCULAR | Status: AC | PRN

## 2015-10-07 MED ORDER — HYDROMORPHONE 1 MG/ML IV SOLN
INTRAVENOUS | Status: DC
  Administered 2015-10-07: via INTRAVENOUS
  Administered 2015-10-08: 1.8 mg via INTRAVENOUS
  Administered 2015-10-08: 2.4 mg via INTRAVENOUS
  Administered 2015-10-08 (×2): 1.8 mg via INTRAVENOUS
  Administered 2015-10-08: 6.3 mg via INTRAVENOUS
  Administered 2015-10-09: 2.7 mg via INTRAVENOUS
  Administered 2015-10-09: 3.3 mg via INTRAVENOUS
  Administered 2015-10-09: 1.5 mg via INTRAVENOUS
  Administered 2015-10-09: 1.2 mg via INTRAVENOUS
  Filled 2015-10-07: qty 25

## 2015-10-07 MED ORDER — NALOXONE HCL 0.4 MG/ML IJ SOLN: 0.4000 mg | INTRAMUSCULAR | Status: DC | PRN

## 2015-10-07 MED ORDER — ONDANSETRON HCL 4 MG/2ML IJ SOLN: 4.0000 mg | Freq: Four times a day (QID) | INTRAMUSCULAR | Status: DC | PRN

## 2015-10-07 MED ORDER — MORPHINE SULFATE (PF) 4 MG/ML IV SOLN
8.0000 mg | Freq: Once | INTRAVENOUS | Status: AC
  Administered 2015-10-07: 8 mg via INTRAVENOUS
  Filled 2015-10-07: qty 2

## 2015-10-07 MED ORDER — KETOROLAC TROMETHAMINE 30 MG/ML IJ SOLN
30.0000 mg | Freq: Once | INTRAMUSCULAR | Status: AC
  Administered 2015-10-07: 30 mg via INTRAVENOUS
  Filled 2015-10-07: qty 1

## 2015-10-07 MED ORDER — HYDROMORPHONE HCL 1 MG/ML IJ SOLN
1.0000 mg | Freq: Once | INTRAMUSCULAR | Status: DC
Start: 2015-10-07 — End: 2015-10-07

## 2015-10-07 MED ORDER — DIPHENHYDRAMINE HCL 50 MG/ML IJ SOLN: 12.5000 mg | Freq: Four times a day (QID) | INTRAMUSCULAR | Status: DC | PRN

## 2015-10-07 MED ORDER — PNEUMOCOCCAL VAC POLYVALENT 25 MCG/0.5ML IJ INJ
0.5000 mL | INJECTION | INTRAMUSCULAR | Status: AC
  Administered 2015-10-08: 0.5 mL via INTRAMUSCULAR
  Filled 2015-10-07 (×2): qty 0.5

## 2015-10-07 MED ORDER — IOHEXOL 300 MG/ML  SOLN
100.0000 mL | Freq: Once | INTRAMUSCULAR | Status: AC | PRN
  Administered 2015-10-07: 100 mL via INTRAVENOUS

## 2015-10-07 MED ORDER — DIPHENHYDRAMINE HCL 12.5 MG/5ML PO ELIX
12.5000 mg | ORAL_SOLUTION | Freq: Four times a day (QID) | ORAL | Status: DC | PRN
  Administered 2015-10-09: 12.5 mg via ORAL
  Filled 2015-10-07: qty 5

## 2015-10-07 MED ORDER — SODIUM CHLORIDE 0.9 % IJ SOLN: 9.0000 mL | INTRAMUSCULAR | Status: DC | PRN

## 2015-10-07 MED ORDER — KCL IN DEXTROSE-NACL 20-5-0.45 MEQ/L-%-% IV SOLN
INTRAVENOUS | Status: DC
  Administered 2015-10-07: 50 mL/h via INTRAVENOUS
  Administered 2015-10-08 – 2015-10-10 (×3): via INTRAVENOUS
  Filled 2015-10-07 (×7): qty 1000

## 2015-10-07 MED ORDER — HYDROMORPHONE HCL 1 MG/ML IJ SOLN
1.0000 mg | INTRAMUSCULAR | Status: DC | PRN
  Administered 2015-10-07: 1 mg via INTRAVENOUS
  Filled 2015-10-07: qty 1

## 2015-10-07 MED ORDER — SODIUM CHLORIDE 0.9 % IV BOLUS (SEPSIS)
1000.0000 mL | Freq: Once | INTRAVENOUS | Status: AC
  Administered 2015-10-07: 1000 mL via INTRAVENOUS

## 2015-10-07 MED ORDER — FENTANYL CITRATE (PF) 100 MCG/2ML IJ SOLN
50.0000 ug | INTRAMUSCULAR | Status: DC | PRN
  Administered 2015-10-07: 50 ug via INTRAVENOUS
  Filled 2015-10-07: qty 2

## 2015-10-07 MED ORDER — HYDROMORPHONE HCL 1 MG/ML IJ SOLN
1.0000 mg | Freq: Once | INTRAMUSCULAR | Status: AC
  Administered 2015-10-07: 1 mg via INTRAVENOUS
  Filled 2015-10-07: qty 1

## 2015-10-07 MED ORDER — ONDANSETRON HCL 4 MG/2ML IJ SOLN
4.0000 mg | Freq: Once | INTRAMUSCULAR | Status: AC
  Administered 2015-10-07: 4 mg via INTRAVENOUS
  Filled 2015-10-07: qty 2

## 2015-10-07 MED ORDER — IOHEXOL 300 MG/ML  SOLN
25.0000 mL | Freq: Once | INTRAMUSCULAR | Status: AC | PRN
  Administered 2015-10-07: 25 mL via ORAL

## 2015-10-07 NOTE — H&P (Signed)
Preoperative History and Physical  Martha Schwartz is a 54 y.o. G1P0010 with No LMP recorded. Patient is not currently having periods (Reason: Perimenopausal). admitted for a large left pelvic mass 22 cm with new onset of pain that could not be managed as an outpatient.  Pt had sudden onset of left lower quadrant pain at 1030 this am, was not having any pain prior to that The intensity was severe, as bad as pt has ever had and located in the left lower quadrant radiating to the back  [facility] [place] and CT and subsequent sonogram reveals a cystic mass 22 cm, appears almost all simple cystic to me upon reviewing the images, with no free fluid no ascites no other lesions of the peritoneum or omentum and positive venous and arterial blood flow, ruling out torsion.  Torsion would be highly unlikely given the size of the mass but it could be compression from the mass causing the pain suddenly and of course the mass has been there very likely for quite some time  I was called on 2 occasions and Dr Roselie Awkward also called on pt status.  The physicians were not able to adequately manage her pain so I accepted the patient in transfer for pain management and consideration for operative management of this large mass, all of the characteristics of which point to it being benign.  I discussed with the patient regarding the need for surgical removal and she understands.  Given her weight and the size of the mass I told her it most prudent to wait until tomorrow procedure.  She is under the care of a chronic pain clinic, [facility], in [place] for back knee and neck pain  PMH:    Past Medical History  Diagnosis Date  . Hypertension   . Asthma   . GERD (gastroesophageal reflux disease)   . Anxiety   . Thyroid disease   . Crack cocaine use   . Diabetes mellitus without complication (HCC)     PSH:     Past Surgical History  Procedure Laterality Date  . Back surgery    . Knee  surgery      POb/GynH:      OB History    Gravida Para Term Preterm AB TAB SAB Ectopic Multiple Living   1    1 1           SH:   Social History  Substance Use Topics  . Smoking status: Former Smoker    Types: Cigarettes  . Smokeless tobacco: Current User    Types: Snuff  . Alcohol Use: No    FH:   History reviewed. No pertinent family history.   Allergies:  Allergies  Allergen Reactions  . Sulfa Antibiotics Rash    Medications:       Current facility-administered medications:  .  dextrose 5 % and 0.45 % NaCl with KCl 20 mEq/L infusion, , Intravenous, Continuous, Florian Buff, MD .  ondansetron [facility]) injection 4 mg, 4 mg, Intravenous, Q8H PRN, Veryl Speak, MD .  Derrill Memo ON 10/08/2015] pneumococcal 23 valent vaccine (PNU-IMMUNE) injection 0.5 mL, 0.5 mL, Intramuscular, Tomorrow-1000, Florian Buff, MD  Review of Systems:   Review of Systems  Constitutional: Negative for fever, chills, weight loss, malaise/fatigue and diaphoresis.  HENT: Negative for hearing loss, ear pain, nosebleeds, congestion, sore throat, neck pain, tinnitus and ear discharge.   Eyes: Negative for blurred vision, double vision, photophobia, pain, discharge and redness.  Respiratory:  Negative for cough, hemoptysis, sputum production, shortness of breath, wheezing and stridor.   Cardiovascular: Negative for chest pain, palpitations, orthopnea, claudication, leg swelling and PND.  Gastrointestinal: Positive for abdominal pain. Negative for heartburn, nausea, vomiting, diarrhea, constipation, blood in stool and melena.  Genitourinary: Negative for dysuria, urgency, frequency, hematuria and flank pain.  Musculoskeletal: Negative for myalgias, back pain, joint pain and falls.  Skin: Negative for itching and rash.  Neurological: Negative for dizziness, tingling, tremors, sensory change, speech change, focal weakness, seizures, loss of consciousness, weakness and headaches.  Endo/Heme/Allergies: Negative  for environmental allergies and polydipsia. Does not bruise/bleed easily.  Psychiatric/Behavioral: Negative for depression, suicidal ideas, hallucinations, memory loss and substance abuse. The patient is not nervous/anxious and does not have insomnia.      PHYSICAL EXAM:  Blood pressure 112/62, pulse 103, temperature 97.7 F (36.5 C), temperature source Oral, resp. rate 20, height 5\' 7"  (1.702 m), weight 322 lb 0.2 oz (146.064 kg), SpO2 99 %.    Vitals reviewed. Constitutional: She is oriented to person, place, and time. She appears well-developed and well-nourished.  HENT:  Head: Normocephalic and atraumatic.  Right Ear: External ear normal.  Left Ear: External ear normal.  Nose: Nose normal.  Mouth/Throat: Oropharynx is clear and moist.  Eyes: Conjunctivae and EOM are normal. Pupils are equal, round, and reactive to light. Right eye exhibits no discharge. Left eye exhibits no discharge. No scleral icterus.  Neck: Normal range of motion. Neck supple. No tracheal deviation present. No thyromegaly present.  Cardiovascular: Normal rate, regular rhythm, normal heart sounds and intact distal pulses.  Exam reveals no gallop and no friction rub.   No murmur heard. Respiratory: Effort normal and breath sounds normal. No respiratory distress. She has no wheezes. She has no rales. She exhibits no tenderness.  GI: Soft. Bowel sounds are normal. . There is tenderness, severe in the LLQ. There is no rebound and + guarding.  Genitourinary:       Vulva is normal without lesions Vagina is pink moist without discharge Cervix deferred Uterus is per sonogram Adnexa is per CT and sonogram Musculoskeletal: Normal range of motion. She exhibits no edema and no tenderness.  Neurological: She is alert and oriented to person, place, and time. She has normal reflexes. She displays normal reflexes. No cranial nerve deficit. She exhibits normal muscle tone. Coordination normal.  Skin: Skin is warm and dry. No  rash noted. No erythema. No pallor.  Psychiatric: She has a normal mood and affect. Her behavior is normal. Judgment and thought content normal.    Labs: Results for orders placed or performed during the hospital encounter of 10/07/15 (from the past 336 hour(s))  Urinalysis, Routine w reflex microscopic (not at [facility])   Collection Time: 10/07/15  1:30 PM  Result Value Ref Range   Color, Urine YELLOW YELLOW   APPearance TURBID (A) CLEAR   Specific Gravity, Urine 1.018 1.005 - 1.030   pH 8.0 5.0 - 8.0   Glucose, UA NEGATIVE NEGATIVE mg/dL   Hgb urine dipstick NEGATIVE NEGATIVE   Bilirubin Urine NEGATIVE NEGATIVE   Ketones, ur NEGATIVE NEGATIVE mg/dL   Protein, ur NEGATIVE NEGATIVE mg/dL   Nitrite NEGATIVE NEGATIVE   Leukocytes, UA SMALL (A) NEGATIVE  Pregnancy, urine   Collection Time: 10/07/15  1:30 PM  Result Value Ref Range   Preg Test, Ur NEGATIVE NEGATIVE  Urine microscopic-add on   Collection Time: 10/07/15  1:30 PM  Result Value Ref Range   Squamous Epithelial /  LPF 0-5 (A) NONE SEEN   WBC, UA 0-5 0 - 5 WBC/hpf   RBC / HPF 0-5 0 - 5 RBC/hpf   Bacteria, UA RARE (A) NONE SEEN   Urine-Other AMORPHOUS URATES/PHOSPHATES   Comprehensive metabolic panel   Collection Time: 2015-10-12  2:15 PM  Result Value Ref Range   Sodium 137 135 - 145 mmol/L   Potassium 3.7 3.5 - 5.1 mmol/L   Chloride 103 101 - 111 mmol/L   CO2 25 22 - 32 mmol/L   Glucose, Bld 164 (H) 65 - 99 mg/dL   BUN 15 6 - 20 mg/dL   Creatinine, Ser 0.75 0.44 - 1.00 mg/dL   Calcium 9.5 8.9 - 10.3 mg/dL   Total Protein 7.1 6.5 - 8.1 g/dL   Albumin 3.7 3.5 - 5.0 g/dL   AST 36 15 - 41 U/L   ALT 33 14 - 54 U/L   Alkaline Phosphatase 80 38 - 126 U/L   Total Bilirubin 0.7 0.3 - 1.2 mg/dL   GFR calc non Af Amer >60 >60 mL/min   GFR calc Af Amer >60 >60 mL/min   Anion gap 9 5 - 15  CBC   Collection Time: Oct 12, 2015  2:15 PM  Result Value Ref Range   WBC 8.9 4.0 - 10.5 K/uL   RBC 4.33 3.87 - 5.11 MIL/uL   Hemoglobin  12.8 12.0 - 15.0 g/dL   HCT 38.7 36.0 - 46.0 %   MCV 89.4 78.0 - 100.0 fL   MCH 29.6 26.0 - 34.0 pg   MCHC 33.1 30.0 - 36.0 g/dL   RDW 13.5 11.5 - 15.5 %   Platelets 253 150 - 400 K/uL  Lipase, blood   Collection Time: 2015/10/12  2:15 PM  Result Value Ref Range   Lipase 21 11 - 51 U/L  Differential   Collection Time: 10-12-2015  2:15 PM  Result Value Ref Range   Neutrophils Relative % 73 %   Neutro Abs 6.6 1.7 - 7.7 K/uL   Lymphocytes Relative 17 %   Lymphs Abs 1.5 0.7 - 4.0 K/uL   Monocytes Relative 7 %   Monocytes Absolute 0.6 0.1 - 1.0 K/uL   Eosinophils Relative 2 %   Eosinophils Absolute 0.2 0.0 - 0.7 K/uL   Basophils Relative 1 %   Basophils Absolute 0.1 0.0 - 0.1 K/uL  I-Stat CG4 Lactic Acid, ED   Collection Time: 10/12/2015  2:30 PM  Result Value Ref Range   Lactic Acid, Venous 2.12 (HH) 0.5 - 2.0 mmol/L   Comment NOTIFIED PHYSICIAN     EKG: Orders placed or performed in visit on 06/21/13  . EKG 12-Lead    Imaging Studies: US Transvaginal Non-ob  2015-10-12  CLINICAL DATA:  Sudden onset left adnexal pain. EXAM: TRANSABDOMINAL AND TRANSVAGINAL ULTRASOUND OF PELVIS TECHNIQUE: Both transabdominal and transvaginal ultrasound examinations of the pelvis were performed. Transabdominal technique was performed for global imaging of the pelvis including uterus, ovaries, adnexal regions, and pelvic cul-de-sac. It was necessary to proceed with endovaginal exam following the transabdominal exam to visualize the uterus and ovaries. COMPARISON:  CT abdomen pelvis October 12, 2015 FINDINGS: Uterus Measurements: 7 x 3.4 x 4.1 cm. There is generalized heterogeneity of myometrium. Endometrium Thickness: 8 mm.  The endometrium is heterogeneous. Right ovary Not definitely seen. Left ovary There is a large complex cystic mass in the left adnexa measuring 22 x 18 x 23 cm. Positive color, arterial and venous flow are identified in the soft tissues adjacent to  the large complex mass. Other findings  No free fluid. IMPRESSION: Large complex cystic mass in the left adnexa measuring 22 x 18 x 23 cm. This correlates to the mass seen on recent CT. 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 Normal-sized uterus with generalized heterogeneity of the myometrium. This is nonspecific but can be seen in myomatous infiltration. The endometrium is a heterogeneous and measures 8 mm. If the patient is postmenopausal, the endometrium is abnormally thickened. Electronically Signed   By: Abelardo Diesel M.D.   On: 10/07/2015 18:17   US Pelvis Complete  10/07/2015  CLINICAL DATA:  Sudden onset left adnexal pain. EXAM: TRANSABDOMINAL AND TRANSVAGINAL ULTRASOUND OF PELVIS TECHNIQUE: Both transabdominal and transvaginal ultrasound examinations of the pelvis were performed. Transabdominal technique was performed for global imaging of the pelvis including uterus, ovaries, adnexal regions, and pelvic cul-de-sac. It was necessary to proceed with endovaginal exam following the transabdominal exam to visualize the uterus and ovaries. COMPARISON:  CT abdomen pelvis October 07, 2015 FINDINGS: Uterus Measurements: 7 x 3.4 x 4.1 cm. There is generalized heterogeneity of myometrium. Endometrium Thickness: 8 mm.  The endometrium is heterogeneous. Right ovary Not definitely seen. Left ovary There is a large complex cystic mass in the left adnexa measuring 22 x 18 x 23 cm. Positive color, arterial and venous flow are identified in the soft tissues adjacent to the large complex mass. Other findings No free fluid. IMPRESSION: Large complex cystic mass in the left adnexa measuring 22 x 18 x 23 cm. This correlates to the mass seen on recent CT. 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 Normal-sized uterus with generalized heterogeneity of the myometrium. This is nonspecific but can be seen in myomatous infiltration. The endometrium is a heterogeneous and measures 8 mm. If the patient is postmenopausal, the endometrium is  abnormally thickened. Electronically Signed   By: Abelardo Diesel M.D.   On: 10/07/2015 18:17   Ct Abdomen Pelvis W Contrast  10/07/2015  CLINICAL DATA:  54 year old female with left lower quadrant pain and nausea since this morning. Initial encounter. EXAM: CT ABDOMEN AND PELVIS WITH CONTRAST TECHNIQUE: Multidetector CT imaging of the abdomen and pelvis was performed using the standard protocol following bolus administration of intravenous contrast. CONTRAST:  58mL OMNIPAQUE IOHEXOL 300 MG/ML SOLN, 128mL OMNIPAQUE IOHEXOL 300 MG/ML SOLN COMPARISON:  [facility] CT Abdomen and Pelvis 08/25/2011. FINDINGS: Cardiomegaly and mediastinal lipomatosis. Negative lung bases. No pericardial or pleural effusion. Large body habitus. Degenerative changes throughout the spine. No acute osseous abnormality identified. In the pelvis there is no free fluid. The rectum is decompressed. Urinary bladder is diminutive. The uterus is not enlarged. However, inseparable from both adnexa (left series 3, image 77 and right image 72) is a very large lobulated fluid density (9 Hounsfield units) mass encompassing 14.7 x 18.1 x 20.4 cm (AP by transverse by CC). This projects out of the pelvis into the lower abdomen above the level of the emboli kiss (sagittal image 72). In 2012 7.8 cm simple appearing left adnexal cyst was present. The right adnexal was normal at that time. Major arterial structures in the abdomen and pelvis are patent. Portal venous system is patent. No lymphadenopathy in the abdomen or pelvis. Mild mass effect on the sigmoid colon from the large mass which otherwise appears normal. Redundant hepatic flexures with retained stool throughout the more proximal colon. Negative appendix and terminal ileum. No dilated small bowel. No abdominal free fluid. Oral contrast in the stomach and duodenum which  appear normal. Liver, gallbladder, spleen, pancreas, adrenal glands, and kidneys are within normal limits.  IMPRESSION: 1. 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 CT appearance is unilocular, with no associated ascites or metastatic disease identified. 2. Otherwise negative abdomen and pelvis. Electronically Signed   By: Genevie Ann M.D.   On: 10/07/2015 16:03      Assessment: Large cystic ovarian mass, highly favor benign due to size, internal characteristics, unilateral and no other associated intra abdominal pathology  Severe sudden onset LLQ pain  Patient Active Problem List   Diagnosis Date Noted  . Ovarian mass 10/07/2015  . Pelvic mass in female 10/07/2015  . GERD (gastroesophageal reflux disease) 06/22/2013  . Chronic pain 06/22/2013  . History of ETOH abuse 06/22/2013  . History of narcotic use 06/22/2013  . Morbid obesity ([place]) 06/22/2013  . Tachycardia 06/22/2013  . HTN (hypertension) 06/22/2013  . Hypothyroidism 06/22/2013    Plan: Admit for pain management with Dilaudid PCA Keep NPO after midnight in anticipation of possible exp lap with removal tomorrow CA 125, T&S in am  Sahily Biddle H 10/07/2015 11:28 PM

## 2015-10-07 NOTE — ED Notes (Signed)
LLQ pain x 1.5 hours

## 2015-10-07 NOTE — ED Provider Notes (Signed)
Martha Schwartz and from Dr. Laneta Simmers at shift change. Patient was found on CT scan to have a large pelvic mass. Ultrasound reveals no evidence for torsion. This patient has continued to experience severe pain and the pain medications administered have had basically no effect on her discomfort. I've discussed this case with Dr. Elonda Husky at [facility] was advised me to try to give Toradol. This was administered, again with no relief. She has now received 16 mg of morphine, 2 mg of dilaudid, and 50 g of fentanyl. None of this has given her any relief.  I have again spoken with Dr. Elonda Husky and updated him of her condition. He is agreed to accept the patient in transfer for pain management and further workup.  Veryl Speak, MD 10/07/15 [place]

## 2015-10-07 NOTE — ED Notes (Signed)
Dr. Laneta Simmers notified of pt condition and to bedside with [place] at this time.

## 2015-10-07 NOTE — ED Notes (Signed)
C/o nausea. Dr Laneta Simmers aware.

## 2015-10-07 NOTE — ED Notes (Signed)
Pt has called out repeatedly for pain medicine, states she had no relief from previous administration of Dilaudid. Spoke with Dr. Stark Jock who states is okay to redose Dilaudid at this time. Pt is hemodynamically stable and BP/O2 sat are tolerating pain medicines well.

## 2015-10-07 NOTE — ED Provider Notes (Signed)
CSN: [id number]     Arrival date & time 10/07/15  1245 History   First MD Initiated Contact with Patient 10/07/15 1400     Chief Complaint  Patient presents with  . Abdominal Pain     (Consider location/radiation/quality/duration/timing/severity/associated sxs/prior Treatment) Patient is a 54 y.o. female presenting with abdominal pain. The history is provided by the patient.  Abdominal Pain Pain location:  LLQ Pain quality: sharp   Pain radiates to:  L flank Pain severity:  Severe Onset quality:  Sudden Duration:  2 hours Timing:  Constant Progression:  Waxing and waning Chronicity:  New Context comment:  Sitting at rest, otherwise asymptomatic Relieved by:  Nothing Worsened by:  Nothing tried Ineffective treatments:  None tried Associated symptoms: constipation (chronic), nausea and vomiting   Associated symptoms: no fever, no vaginal bleeding and no vaginal discharge   Risk factors: obesity     Past Medical History  Diagnosis Date  . Hypertension   . Asthma   . GERD (gastroesophageal reflux disease)   . Anxiety   . Thyroid disease   . Crack cocaine use    Past Surgical History  Procedure Laterality Date  . Back surgery    . Knee surgery     No family history on file. Social History  Substance Use Topics  . Smoking status: Former Smoker    Types: Cigarettes  . Smokeless tobacco: Current User    Types: Snuff  . Alcohol Use: No   OB History    No data available     Review of Systems  Constitutional: Negative for fever.  Gastrointestinal: Positive for nausea, vomiting, abdominal pain and constipation (chronic).  Genitourinary: Negative for vaginal bleeding and vaginal discharge.  All other systems reviewed and are negative.     Allergies  Sulfa antibiotics  Home Medications   Prior to Admission medications   Medication Sig Start Date End Date Taking? Authorizing Provider  ALBUTEROL IN Inhale into the lungs as needed.    Historical Provider, MD    ALPRAZOLAM PO Take 1 mg by mouth 2 (two) times daily.     Historical Provider, MD  amLODipine (NORVASC) 5 MG tablet Take 10 mg by mouth daily.     Historical Provider, MD  docusate sodium (COLACE) 100 MG capsule Take 100 mg by mouth 2 (two) times daily.    Historical Provider, MD  DULOXETINE HCL PO Take 120 mg by mouth daily.     Historical Provider, MD  HYDROcodone-acetaminophen (NORCO) 10-325 MG per tablet Take 1 tablet by mouth every 6 (six) hours as needed for pain. 06/19/13   Ezequiel Essex, MD  hydrocortisone 2.5 % cream Apply 1 application topically daily.    Historical Provider, MD  ibuprofen (ADVIL,MOTRIN) 200 MG tablet Take 200 mg by mouth every 6 (six) hours as needed for pain.    Historical Provider, MD  LEVOTHYROXINE SODIUM PO Take 50 mg by mouth daily.     Historical Provider, MD  lisinopril-hydrochlorothiazide (PRINZIDE,ZESTORETIC) 20-25 MG per tablet Take 1 tablet by mouth daily.    Historical Provider, MD  meloxicam (MOBIC) 7.5 MG tablet Take 7.5 mg by mouth daily.    Historical Provider, MD  METHOCARBAMOL PO Take 750-1,500 mg by mouth 2 (two) times daily.     Historical Provider, MD  metoprolol succinate (TOPROL-XL) 25 MG 24 hr tablet Take 1 tablet (25 mg total) by mouth daily. *Patient needs appointment for further refills*    Pixie Casino, MD  OMEPRAZOLE PO Take 20  mg by mouth daily.     Historical Provider, MD  OXYBUTYNIN CHLORIDE PO Take 5 mg by mouth.     Historical Provider, MD  tiotropium (SPIRIVA) 18 MCG inhalation capsule Place 18 mcg into inhaler and inhale daily.    Historical Provider, MD  tobramycin-dexamethasone [facility]) ophthalmic solution Place 1 drop into both eyes every 4 (four) hours while awake.    Historical Provider, MD   BP 103/68 mmHg  Pulse 60  Temp(Src) 98 F (36.7 C) (Oral)  Resp 18  Ht 5\' 7"  (1.702 m)  Wt 325 lb (147.419 kg)  BMI 50.89 kg/m2  SpO2 96% Physical Exam  Constitutional: She is oriented to person, place, and time. She appears  well-developed and well-nourished. She appears distressed.  HENT:  Head: Normocephalic.  Eyes: Conjunctivae are normal.  Neck: Neck supple. No tracheal deviation present.  Cardiovascular: Normal rate and regular rhythm.   Pulmonary/Chest: Effort normal. No respiratory distress.  Abdominal: Soft. She exhibits no distension. There is tenderness. There is no rigidity, no guarding and negative Murphy's sign.    Obese abdomen  Neurological: She is alert and oriented to person, place, and time.  Skin: Skin is warm. She is diaphoretic.  Psychiatric: She has a normal mood and affect.    ED Course  Procedures (including critical care time)  Emergency Focused Ultrasound Exam Limited Retroperitoneal Ultrasound of Kidneys  Performed and interpreted by Dr. Laneta Simmers Focused abdominal ultrasound with both kidneys imaged in transverse and longitudinal planes in real-time. Indication: flank pain Findings: bilateral kidneys present, no shadowing, no anechoic areas Interpretation: no hydronephrosis visualized.  no stones or cysts visualized  Images archived electronically  CPT Code: [id number]   Labs Review Labs Reviewed  URINALYSIS, ROUTINE W REFLEX MICROSCOPIC (NOT AT [facility]) - Abnormal; Notable for the following:    APPearance TURBID (*)    Leukocytes, UA SMALL (*)    All other components within normal limits  URINE MICROSCOPIC-ADD ON - Abnormal; Notable for the following:    Squamous Epithelial / LPF 0-5 (*)    Bacteria, UA RARE (*)    All other components within normal limits  COMPREHENSIVE METABOLIC PANEL - Abnormal; Notable for the following:    Glucose, Bld 164 (*)    All other components within normal limits  I-STAT CG4 LACTIC ACID, ED - Abnormal; Notable for the following:    Lactic Acid, Venous 2.12 (*)    All other components within normal limits  PREGNANCY, URINE  CBC  LIPASE, BLOOD  DIFFERENTIAL  CBC WITH DIFFERENTIAL/PLATELET    Imaging Review Ct Abdomen Pelvis W  Contrast  10/07/2015  CLINICAL DATA:  53 year old female with left lower quadrant pain and nausea since this morning. Initial encounter. EXAM: CT ABDOMEN AND PELVIS WITH CONTRAST TECHNIQUE: Multidetector CT imaging of the abdomen and pelvis was performed using the standard protocol following bolus administration of intravenous contrast. CONTRAST:  58mL OMNIPAQUE IOHEXOL 300 MG/ML SOLN, 147mL OMNIPAQUE IOHEXOL 300 MG/ML SOLN COMPARISON:  [facility] CT Abdomen and Pelvis 08/25/2011. FINDINGS: Cardiomegaly and mediastinal lipomatosis. Negative lung bases. No pericardial or pleural effusion. Large body habitus. Degenerative changes throughout the spine. No acute osseous abnormality identified. In the pelvis there is no free fluid. The rectum is decompressed. Urinary bladder is diminutive. The uterus is not enlarged. However, inseparable from both adnexa (left series 3, image 77 and right image 72) is a very large lobulated fluid density (9 Hounsfield units) mass encompassing 14.7 x 18.1 x 20.4 cm (AP by  transverse by CC). This projects out of the pelvis into the lower abdomen above the level of the emboli kiss (sagittal image 72). In 2012 7.8 cm simple appearing left adnexal cyst was present. The right adnexal was normal at that time. Major arterial structures in the abdomen and pelvis are patent. Portal venous system is patent. No lymphadenopathy in the abdomen or pelvis. Mild mass effect on the sigmoid colon from the large mass which otherwise appears normal. Redundant hepatic flexures with retained stool throughout the more proximal colon. Negative appendix and terminal ileum. No dilated small bowel. No abdominal free fluid. Oral contrast in the stomach and duodenum which appear normal. Liver, gallbladder, spleen, pancreas, adrenal glands, and kidneys are within normal limits. IMPRESSION: 1. Very large (20 cm) cystic pelvic mass inseparable from both ovaries and consistent with cystic ovarian  neoplasm. CT appearance is unilocular, with no associated ascites or metastatic disease identified. 2. Otherwise negative abdomen and pelvis. Electronically Signed   By: Genevie Ann M.D.   On: 10/07/2015 16:03   I have personally reviewed and evaluated these images and lab results as part of my medical decision-making.   EKG Interpretation None      MDM   Final diagnoses:  Cyst of ovary, unspecified laterality    54 y.o. female presents with acute onset left lower quadrant abdominal pain that started just prior to arrival. She is morbidly obese and very difficult to examine. She is crying out for pain medication on arrival and appears very uncomfortable. She is on chronic narcotic therapy by review of the [place] and was last prescribed 97 oxycodone 15 mg tablets 29 days ago. She does have issues with ongoing constipation but has not had any previous abdominal surgeries.   CT scan returns with large cystic mass on the ovaries which is difficult to differentiate. It appears to be a 20 cm cystic mass in with sudden onset of excruciating unilateral lower abdominal pain and mild elevation of lactic acid this is concerning clinically for possible ovarian torsion secondary to repositioning of this large structure around the adnexa. I spoke with Dr. Roselie Awkward who was able to evaluate the patient's imaging and he recommended pelvic ultrasound to evaluate for flow to the affected ovary and continue to work on pain control.   Care transferred to Dr. Stark Jock pending results of ultrasound with plan to transfer for formal gynecology evaluation with signs of decreased ovarian flow or other emergent surgical indication. If flow is preserved to the ovary and pain is well-controlled patient can follow-up with outpatient gynecologist that she normally sees or can be referred to the women's center per Dr. Roselie Awkward.  Leo Grosser, MD 10/07/15 [phone]

## 2015-10-08 ENCOUNTER — Encounter (HOSPITAL_COMMUNITY): Payer: Self-pay | Admitting: Anesthesiology

## 2015-10-08 ENCOUNTER — Encounter (HOSPITAL_COMMUNITY): Payer: Self-pay | Admitting: Obstetrics & Gynecology

## 2015-10-08 ENCOUNTER — Encounter (HOSPITAL_COMMUNITY)
Admission: EM | Disposition: A | Discharge status: Home / Self Care | Payer: Self-pay | Source: Home / Self Care | Attending: Obstetrics & Gynecology

## 2015-10-08 DIAGNOSIS — N9489 Other specified conditions associated with female genital organs and menstrual cycle: Secondary | ICD-10-CM

## 2015-10-08 DIAGNOSIS — N839 Noninflammatory disorder of ovary, fallopian tube and broad ligament, unspecified: Secondary | ICD-10-CM

## 2015-10-08 DIAGNOSIS — R102 Pelvic and perineal pain: Secondary | ICD-10-CM

## 2015-10-08 DIAGNOSIS — N83202 Unspecified ovarian cyst, left side: Secondary | ICD-10-CM | POA: Diagnosis not present

## 2015-10-08 LAB — COMPREHENSIVE METABOLIC PANEL
ALBUMIN: 3.6 g/dL (ref 3.5–5.0)
ALK PHOS: 75 U/L (ref 38–126)
ALT: 30 U/L (ref 14–54)
ANION GAP: 8 (ref 5–15)
AST: 27 U/L (ref 15–41)
BILIRUBIN TOTAL: 0.7 mg/dL (ref 0.3–1.2)
BUN: 17 mg/dL (ref 6–20)
CALCIUM: 9 mg/dL (ref 8.9–10.3)
CO2: 27 mmol/L (ref 22–32)
CREATININE: 0.65 mg/dL (ref 0.44–1.00)
Chloride: 99 mmol/L — ABNORMAL LOW (ref 101–111)
GFR calc Af Amer: >60 mL/min (ref >60)
GFR calc non Af Amer: >60 mL/min (ref >60)
GLUCOSE: 139 mg/dL — AB (ref 65–99)
Potassium: 3.9 mmol/L (ref 3.5–5.1)
SODIUM: 134 mmol/L — AB (ref 135–145)
TOTAL PROTEIN: 6.6 g/dL (ref 6.5–8.1)

## 2015-10-08 LAB — TYPE AND SCREEN
ABO/RH(D): O POS
Antibody Screen: NEGATIVE

## 2015-10-08 LAB — GLUCOSE, CAPILLARY
GLUCOSE-CAPILLARY: 120 mg/dL — AB (ref 65–99)
Glucose-Capillary: 124 mg/dL — ABNORMAL HIGH (ref 65–99)
Glucose-Capillary: 107 mg/dL — ABNORMAL HIGH (ref 65–99)

## 2015-10-08 LAB — TSH: TSH: 3.211 u[IU]/mL (ref 0.350–4.500)

## 2015-10-08 LAB — ABO/RH: ABO/RH(D): O POS

## 2015-10-08 LAB — RAPID URINE DRUG SCREEN, HOSP PERFORMED
AMPHETAMINES: NOT DETECTED
Barbiturates: NOT DETECTED
Benzodiazepines: POSITIVE — AB
Cocaine: NOT DETECTED
OPIATES: POSITIVE — AB
Tetrahydrocannabinol: NOT DETECTED

## 2015-10-08 LAB — MRSA PCR SCREENING: MRSA BY PCR: POSITIVE — AB

## 2015-10-08 SURGERY — LAPAROTOMY, EXPLORATORY
Anesthesia: Choice

## 2015-10-08 MED ORDER — METHOCARBAMOL 750 MG PO TABS
750.0000 mg | ORAL_TABLET | Freq: Four times a day (QID) | ORAL | Status: DC | PRN
  Administered 2015-10-10 – 2015-10-11 (×2): 750 mg via ORAL
  Filled 2015-10-08 (×3): qty 1

## 2015-10-08 MED ORDER — MUPIROCIN 2 % EX OINT
1.0000 "application " | TOPICAL_OINTMENT | Freq: Two times a day (BID) | CUTANEOUS | Status: DC
  Administered 2015-10-08 – 2015-10-11 (×7): 1 via NASAL
  Filled 2015-10-08 (×2): qty 22

## 2015-10-08 MED ORDER — MIRTAZAPINE 15 MG PO TABS
15.0000 mg | ORAL_TABLET | Freq: Every evening | ORAL | Status: DC | PRN
  Filled 2015-10-08: qty 1

## 2015-10-08 MED ORDER — SIMVASTATIN 20 MG PO TABS
20.0000 mg | ORAL_TABLET | Freq: Every day | ORAL | Status: DC
  Administered 2015-10-08 – 2015-10-10 (×3): 20 mg via ORAL
  Filled 2015-10-08 (×4): qty 1

## 2015-10-08 MED ORDER — LUBIPROSTONE 24 MCG PO CAPS
24.0000 ug | ORAL_CAPSULE | Freq: Two times a day (BID) | ORAL | Status: DC
  Administered 2015-10-08 – 2015-10-11 (×6): 24 ug via ORAL
  Filled 2015-10-08 (×9): qty 1

## 2015-10-08 MED ORDER — METFORMIN HCL 500 MG PO TABS
500.0000 mg | ORAL_TABLET | Freq: Two times a day (BID) | ORAL | Status: DC
  Administered 2015-10-08 – 2015-10-11 (×5): 500 mg via ORAL
  Filled 2015-10-08 (×8): qty 1

## 2015-10-08 MED ORDER — CHLORHEXIDINE GLUCONATE CLOTH 2 % EX PADS
6.0000 | MEDICATED_PAD | Freq: Every day | CUTANEOUS | Status: DC
  Administered 2015-10-08 – 2015-10-11 (×4): 6 via TOPICAL

## 2015-10-08 MED ORDER — PANTOPRAZOLE SODIUM 40 MG PO TBEC
40.0000 mg | DELAYED_RELEASE_TABLET | Freq: Every day | ORAL | Status: DC
  Administered 2015-10-08 – 2015-10-11 (×4): 40 mg via ORAL
  Filled 2015-10-08 (×4): qty 1

## 2015-10-08 MED ORDER — GABAPENTIN 600 MG PO TABS
600.0000 mg | ORAL_TABLET | Freq: Three times a day (TID) | ORAL | Status: DC
  Administered 2015-10-08 (×2): 600 mg via ORAL
  Filled 2015-10-08 (×4): qty 1

## 2015-10-08 MED ORDER — DOCUSATE SODIUM 100 MG PO CAPS
100.0000 mg | ORAL_CAPSULE | Freq: Two times a day (BID) | ORAL | Status: DC | PRN
  Administered 2015-10-08: 100 mg via ORAL
  Filled 2015-10-08: qty 1

## 2015-10-08 MED ORDER — DULOXETINE HCL 60 MG PO CPEP
60.0000 mg | ORAL_CAPSULE | Freq: Every day | ORAL | Status: DC
  Administered 2015-10-08 – 2015-10-11 (×4): 60 mg via ORAL
  Filled 2015-10-08 (×6): qty 1

## 2015-10-08 NOTE — Progress Notes (Signed)
Release of information was faxed to [facility] and Dr. Micah Noel in [facility]. No records have been received on [facility] at this time, but I notified oncoming staff to watch for documents. I requested for physicians from each facility to contact our Faculty Practice physician when they are able to discuss pts history and control of her medical conditions. I spoke with Dr. Kennon Rounds prior to leaving this evening and she confirms that she has heard from [facility]. Marry Guan

## 2015-10-08 NOTE — Progress Notes (Signed)
22 cm cystic pelvic mass from the left ovary  Subjective: Patient reports ongoing pain in abdomen and back.    Objective: I have reviewed patient's vital signs, intake and output, medications, labs and radiology results.  No change from admission   Assessment/Plan: 22 cm cystic pelvic mass  Dr Gala Romney is aware of pt status and will decide on management for later today    LOS: 1 day    Martha Schwartz H 10/08/2015, 9:10 AM

## 2015-10-08 NOTE — Consult Note (Signed)
Consult Note: Gyn-Onc  Consult was requested by Dr. Gala Romney for the evaluation of Melloney Quintrell 54 y.o. female with a pelvic mass  CC:  Chief Complaint  Patient presents with  . Abdominal Pain    Assessment/Plan:  Ms. Arlita Stingle  is a 54 y.o.  year old with a 23cm multi-cystic central pelvic mass.  I have personally viewed her CT scan from 10/07/15. I have a low suspicion for malignancy based on the appearance of this mass on imaging. She has a history of a smaller (7cm) mass 4 years ago in the left adnexa, and this is likely the same lesion that has grown slowly over time. If this were to be a malignancy, I do not expect that the patient would have these findings or relatively indolent course in growth of the mass over time. Her scan shows no ascites, adenopathy or peritoneal disease. CA 125 is pending.  She states that she is in extreme pain and is using a PCA. However, she appears comfortable at rest for me and does not have an acute surgical abdomen on exam. Given her extremely significant medical comorbidities (morbid obesity, COPD, cardiac disease, diabetes, MRSA colonization) she is at a very high surgical risk for major complications, and therefore optimization should take place prior to a surgical effort given that there is no sign of an emergent indication.  The patient has a history of chronic opioid dependency and has a pain contract with a pain clinic. She takes oxy 15mg  q 3-4 times per day for knee pain.  If the patient develops acute decompensation and requires more urgent surgery, I do not believe that she requires an oncologist present for oncologic reasons. However, I would be happy to take on her surgery in an elective fashion if felt most appropriate by her referring providers. As such, we have scheduled her for an outpatient visit with me next week, anticipating a discharge prior to that time.  HPI: Jerome Huskins is a 54 year old woman who is seen in consultation at the  request of Dr Gala Romney for a 23cm central ovarian mass.  She was admitted to [facility] on 10/07/15 with acute onset of severe abdominal pain, relieved only by IV dilaudid.  She has a past medical history significant for extreme morbid obesity, MRSA positivity and chronic LE wound infections, DM, cardiac disease, COPD. She has chronic pain, and is a chronic opioid user.  CT scan of the abdo/pelvis was performed on 10/07/15 and showed: The uterus is not enlarged. However, inseparable from both adnexa (left series 3, image 77 and right image 72) is a very large lobulated fluid density (9 Hounsfield units) mass encompassing 14.7 x 18.1 x 20.4 cm (AP by transverse by CC). This projects out of the pelvis into the lower abdomen above the level of the emboli kiss (sagittal image 72). In 2012 7.8 cm simple appearing left adnexal cyst was present. The right adnexal was normal at that time.  CA 125 was drawn and is pending.  Current Meds:  No current facility-administered medications on file prior to encounter.   Current Outpatient Prescriptions on File Prior to Encounter  Medication Sig Dispense Refill  . docusate sodium (COLACE) 100 MG capsule Take 100 mg by mouth 2 (two) times daily.    . hydrocortisone 2.5 % cream Apply 1 application topically daily.    Marland Kitchen ibuprofen (ADVIL,MOTRIN) 200 MG tablet Take 800 mg by mouth every 6 (six) hours as needed for pain.     Marland Kitchen  metoprolol succinate (TOPROL-XL) 25 MG 24 hr tablet Take 1 tablet (25 mg total) by mouth daily. *Patient needs appointment for further refills* 30 tablet 0  . lisinopril-hydrochlorothiazide (PRINZIDE,ZESTORETIC) 20-25 MG per tablet Take 1 tablet by mouth daily.       Allergy:  Allergies  Allergen Reactions  . Sulfa Antibiotics Rash    Social Hx:   Social History   Social History  . Marital Status: Single    Spouse Name: N/A  . Number of Children: N/A  . Years of Education: N/A   Occupational History  . Not on file.    Social History Main Topics  . Smoking status: Former Smoker    Types: Cigarettes  . Smokeless tobacco: Current User    Types: Snuff  . Alcohol Use: No  . Drug Use: No  . Sexual Activity: Not on file   Other Topics Concern  . Not on file   Social History Narrative    Past Surgical Hx:  Past Surgical History  Procedure Laterality Date  . Back surgery    . Knee surgery      Past Medical Hx:  Past Medical History  Diagnosis Date  . Hypertension   . Asthma   . GERD (gastroesophageal reflux disease)   . Anxiety   . Thyroid disease   . Crack cocaine use     last use per pt 3 years ago  . Diabetes mellitus without complication ([place])   . Depressed   . 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 Chronic pain disorder     on oxycodone 15 mg 3-4 times a day    Past Gynecological History:   No LMP recorded. Patient is not currently having periods (Reason: Perimenopausal).  Family Hx: History reviewed. No pertinent family history.  Review of Systems:  Constitutional  Feels well,    ENT Normal appearing ears and nares bilaterally Skin/Breast  No rash, sores, jaundice, itching, dryness Cardiovascular  No chest pain, shortness of breath, or edema  Pulmonary  No cough or wheeze.  Gastro Intestinal  No nausea, vomitting, or diarrhoea. No bright red blood per rectum, no abdominal pain, change in bowel movement, or constipation.  Genito Urinary  No frequency, urgency, dysuria, no bleeding Musculo Skeletal  No myalgia, arthralgia, joint swelling or pain  Neurologic  No weakness, numbness, change in gait,  Psychology  No depression, anxiety, insomnia.   Vitals:  Blood pressure 111/72, pulse 84, temperature 97.4 F (36.3 C), temperature source Oral, resp. rate 14, height 5\' 7"  (1.702 m), weight 322 lb 0.2 oz (146.064 kg), SpO2 97 %.  Physical Exam: WD in NAD Neck  Supple NROM, without any enlargements.  Lymph  Node Survey No cervical supraclavicular or inguinal adenopathy Cardiovascular  Pulse normal rate, regularity and rhythm. S1 and S2 normal.  Lungs  Clear to auscultation bilateraly, without wheezes/crackles/rhonchi. Good air movement.  Skin  No rash/lesions/breakdown  Psychiatry  Alert and oriented to person, place, and time  Abdomen  Normoactive bowel sounds, abdomen soft, non-tender and very obese without evidence of hernia. Firmness in left mid abdomen consistent with contour of cystic mass. Back No CVA tenderness Genito Urinary  deferred Extremities  No bilateral cyanosis, clubbing or edema.   Donaciano Eva, MD  10/08/2015, 5:37 PM

## 2015-10-08 NOTE — Progress Notes (Signed)
Patient ID: Martha Schwartz, female   DOB: August 19, 1961, 54 y.o.   MRN: [id number]   Reviewed chart, interviewed and examined patient.  Face to face time 30 minutes.  Pt was in no distress during the interview or exam.  No rebound, no guarding.   Key additional history elements/plan  -Pt is most likely menopausal as she has had "maybe once a year" menses for 7 years.  Endometrium thickened at 74mm.  Pt does have a gyn in [place]).  She has had a pap smear in the past year.   Will get records from Dr. Micah Noel.  [place] pending.  -Pt has a primary care MD at [facility] in [facility].  Requesting records.  Pt was supposed to get a stress test but did not go.  Unsure the indication for test.  Pt does have hypertension and tachycardia based on note on EPIC in 2014.  Pt is not tachycardic currently, but could be due to beta blocker.  Pt can not ambulate across room without being breathless but unsure if this is due to deconditioning or cardiac condition.  Pt does not check CBGs but believes her sugars are well controlled.  She is unsure if she has a recent Hgb A1C with her PCP.  Labs/Tests pending--TSH, CMP, HgbA1C, EKG, medicine consult, records from [place]  -Chronic pain--Pt takes 60 mg oxycodone a day.  Pharmacy to evaluate Dilaudid PCA dosing and compare how much medication she is getting through the PCA and what her maintenance medications are.  It might be possible to increase her Dilaudid dose.  She is in NO distress during our 30 minute conversation.  There is no acute abdomen.  US shows both arterial and venous flow which rules out torsion.    -Gyn Onc consult called at 10:30 am given ovarian mass in post menopausal woman.  CA 125 pending.   -Social--UDS for history of crack cocaine use; urine alcohol metabolite for history of alcohol abuse.  -Pt can start carb modified diet.  Patient needs medical issues addressed and Gyn Onc consult before surgery.

## 2015-10-08 NOTE — Progress Notes (Signed)
PCR swab positive for MRSA. Contact precaution initiated.

## 2015-10-09 LAB — GLUCOSE, CAPILLARY
GLUCOSE-CAPILLARY: 121 mg/dL — AB (ref 65–99)
GLUCOSE-CAPILLARY: 124 mg/dL — AB (ref 65–99)
GLUCOSE-CAPILLARY: 111 mg/dL — AB (ref 65–99)
Glucose-Capillary: 127 mg/dL — ABNORMAL HIGH (ref 65–99)
Glucose-Capillary: 109 mg/dL — ABNORMAL HIGH (ref 65–99)
Glucose-Capillary: 117 mg/dL — ABNORMAL HIGH (ref 65–99)

## 2015-10-09 LAB — ALCOHOL METABOLITE (ETG), URINE: ETGU: NEGATIVE ng/mL

## 2015-10-09 LAB — HEMOGLOBIN A1C
HEMOGLOBIN A1C: 6.3 % — AB (ref 4.8–5.6)
MEAN PLASMA GLUCOSE: 134 mg/dL

## 2015-10-09 LAB — CA 125: CA 125: 10 U/mL (ref 0.0–38.1)

## 2015-10-09 LAB — FOLLICLE STIMULATING HORMONE: FSH: 26.7 m[IU]/mL

## 2015-10-09 MED ORDER — LISINOPRIL 20 MG PO TABS
20.0000 mg | ORAL_TABLET | Freq: Every day | ORAL | Status: DC
  Administered 2015-10-10 – 2015-10-11 (×2): 20 mg via ORAL
  Filled 2015-10-09 (×4): qty 1

## 2015-10-09 MED ORDER — BISACODYL 10 MG RE SUPP
10.0000 mg | Freq: Every day | RECTAL | Status: DC | PRN
  Administered 2015-10-09: 10 mg via RECTAL
  Filled 2015-10-09: qty 1

## 2015-10-09 MED ORDER — GABAPENTIN 600 MG PO TABS: 600.0000 mg | ORAL_TABLET | Freq: Three times a day (TID) | ORAL | Status: DC

## 2015-10-09 MED ORDER — LUBIPROSTONE 24 MCG PO CAPS: 24.0000 ug | ORAL_CAPSULE | Freq: Two times a day (BID) | ORAL | Status: DC

## 2015-10-09 MED ORDER — LEVOTHYROXINE SODIUM 50 MCG PO TABS
50.0000 ug | ORAL_TABLET | Freq: Every day | ORAL | Status: DC
  Administered 2015-10-09 – 2015-10-11 (×3): 50 ug via ORAL
  Filled 2015-10-09 (×4): qty 1

## 2015-10-09 MED ORDER — LISINOPRIL-HYDROCHLOROTHIAZIDE 20-25 MG PO TABS: 1.0000 | ORAL_TABLET | Freq: Every day | ORAL | Status: DC

## 2015-10-09 MED ORDER — HYDROMORPHONE HCL 2 MG PO TABS
2.0000 mg | ORAL_TABLET | ORAL | Status: DC | PRN
  Administered 2015-10-09 (×2): 2 mg via ORAL
  Filled 2015-10-09 (×2): qty 1

## 2015-10-09 MED ORDER — GABAPENTIN 300 MG PO CAPS
600.0000 mg | ORAL_CAPSULE | Freq: Three times a day (TID) | ORAL | Status: DC
  Administered 2015-10-09 – 2015-10-11 (×7): 600 mg via ORAL
  Filled 2015-10-09 (×10): qty 2

## 2015-10-09 MED ORDER — SIMVASTATIN 20 MG PO TABS: 20.0000 mg | ORAL_TABLET | Freq: Every day | ORAL | Status: DC

## 2015-10-09 MED ORDER — POTASSIUM CHLORIDE CRYS ER 20 MEQ PO TBCR
20.0000 meq | EXTENDED_RELEASE_TABLET | Freq: Every day | ORAL | Status: DC
  Administered 2015-10-10 – 2015-10-11 (×2): 20 meq via ORAL
  Filled 2015-10-09 (×4): qty 1

## 2015-10-09 MED ORDER — METOPROLOL SUCCINATE ER 25 MG PO TB24
25.0000 mg | ORAL_TABLET | Freq: Every day | ORAL | Status: DC
  Administered 2015-10-09 – 2015-10-11 (×3): 25 mg via ORAL
  Filled 2015-10-09 (×4): qty 1

## 2015-10-09 MED ORDER — HYDROCHLOROTHIAZIDE 25 MG PO TABS
25.0000 mg | ORAL_TABLET | Freq: Every day | ORAL | Status: DC
  Administered 2015-10-10 – 2015-10-11 (×2): 25 mg via ORAL
  Filled 2015-10-09 (×2): qty 1

## 2015-10-09 MED ORDER — METFORMIN HCL 500 MG PO TABS: 500.0000 mg | ORAL_TABLET | Freq: Two times a day (BID) | ORAL | Status: DC

## 2015-10-09 MED ORDER — DULOXETINE HCL 60 MG PO CPEP
120.0000 mg | ORAL_CAPSULE | Freq: Every day | ORAL | Status: DC
  Filled 2015-10-09: qty 2

## 2015-10-09 MED ORDER — HYDROMORPHONE HCL 2 MG PO TABS
4.0000 mg | ORAL_TABLET | ORAL | Status: DC | PRN
  Administered 2015-10-09 – 2015-10-11 (×11): 4 mg via ORAL
  Filled 2015-10-09 (×11): qty 2

## 2015-10-09 MED ORDER — AMLODIPINE BESYLATE 10 MG PO TABS
10.0000 mg | ORAL_TABLET | Freq: Every day | ORAL | Status: DC
  Administered 2015-10-10 – 2015-10-11 (×2): 10 mg via ORAL
  Filled 2015-10-09 (×4): qty 1

## 2015-10-09 MED ORDER — OXYBUTYNIN CHLORIDE 5 MG PO TABS
5.0000 mg | ORAL_TABLET | Freq: Every day | ORAL | Status: DC
  Administered 2015-10-10 – 2015-10-11 (×2): 5 mg via ORAL
  Filled 2015-10-09 (×4): qty 1

## 2015-10-09 NOTE — Care Management Important Message (Signed)
Important Message  Patient Details  Name: Martha Schwartz MRN: [id number] Date of Birth: [birth year]   Medicare Important Message Given:  Yes    Shelda Altes 10/09/2015, 1:17 [place] Message  Patient Details  Name: Martha Schwartz MRN: [id number] Date of Birth: 1961/01/30   Medicare Important Message Given:  Yes    Shelda Altes 10/09/2015, 1:16 PM

## 2015-10-09 NOTE — Progress Notes (Signed)
Subjective: LLQ pain, constipation Patient reports tolerating PO.  Pain not well controlled, using PCA  Objective: I have reviewed patient's vital signs, medications, labs and radiology results. Blood pressure 111/88, pulse 113, temperature 98 F (36.7 C), temperature source Oral, resp. rate 14, height 5\' 7"  (1.702 m), weight 146.064 kg (322 lb 0.2 oz), SpO2 93 %.  General: alert, cooperative and mild distress GI: abnormal findings:  mass, located in the LLQ and tenderness Extremities: extremities normal, atraumatic, no cyanosis or edema   Assessment/Plan: Large cystic left ovarian mass Pain not well controlled To see Dr Denman George for surgery PO dilaudid Dulcolax suppository   LOS: 2 days    ARNOLD,JAMES 10/09/2015, 12:57 PM

## 2015-10-09 NOTE — Progress Notes (Signed)
Inpatient Diabetes Program Recommendations  AACE/ADA: New Consensus Statement on Inpatient Glycemic Control (2015)  Target Ranges:  Prepandial:   less than 140 mg/dL      Peak postprandial:   less than 180 mg/dL (1-2 hours)      Critically ill patients:  140 - 180 mg/dL   Review of Glycemic Control: Post-partum delivery following GDM  Diabetes history: GDM Outpatient Diabetes medications: Glyburide2.5 mg bid Current orders for Inpatient glycemic control: No meds Inpatient Diabetes Program Recommendations: Please be sure patient will follow up with a primary MD after 6 weeks to ensure patient's glucose levels remain in controlled range, check HgbA1C at that time.  Thank you Rosita Kea, RN, MSN, CDE  Diabetes Inpatient Program Office: [phone] Pager: [phone] 8:00 am to 5:00 pm

## 2015-10-10 DIAGNOSIS — N949 Unspecified condition associated with female genital organs and menstrual cycle: Secondary | ICD-10-CM

## 2015-10-10 DIAGNOSIS — R1900 Intra-abdominal and pelvic swelling, mass and lump, unspecified site: Secondary | ICD-10-CM

## 2015-10-10 LAB — GLUCOSE, CAPILLARY
GLUCOSE-CAPILLARY: 144 mg/dL — AB (ref 65–99)
GLUCOSE-CAPILLARY: 120 mg/dL — AB (ref 65–99)
Glucose-Capillary: 102 mg/dL — ABNORMAL HIGH (ref 65–99)
Glucose-Capillary: 129 mg/dL — ABNORMAL HIGH (ref 65–99)

## 2015-10-10 MED ORDER — SODIUM CHLORIDE 0.9 % IJ SOLN: 3.0000 mL | Freq: Two times a day (BID) | INTRAMUSCULAR | Status: DC

## 2015-10-10 MED ORDER — LORAZEPAM 2 MG/ML IJ SOLN
0.5000 mg | Freq: Once | INTRAMUSCULAR | Status: AC
  Administered 2015-10-10: 0.5 mg via INTRAVENOUS

## 2015-10-10 MED ORDER — SODIUM CHLORIDE 0.9 % IJ SOLN: 3.0000 mL | INTRAMUSCULAR | Status: DC | PRN

## 2015-10-10 MED ORDER — LORAZEPAM 2 MG/ML IJ SOLN: 5.0000 mg | Freq: Once | INTRAMUSCULAR | Status: DC

## 2015-10-10 MED ORDER — IBUPROFEN 800 MG PO TABS
800.0000 mg | ORAL_TABLET | Freq: Three times a day (TID) | ORAL | Status: DC | PRN
  Administered 2015-10-10: 800 mg via ORAL
  Filled 2015-10-10: qty 1

## 2015-10-10 MED ORDER — SODIUM CHLORIDE 0.9 % IV SOLN: 250.0000 mL | INTRAVENOUS | Status: DC | PRN

## 2015-10-10 NOTE — Progress Notes (Signed)
CBG 102 @ 1445. Glucose monitor not transferring data to flow sheet.

## 2015-10-10 NOTE — Progress Notes (Signed)
Subjective: Admitted for 22 cm pelvic mass and pain Patient reports tolerating PO.  Pain well moderately controlled on oral Dilaudid.  Objective: I have reviewed patient's vital signs, medications, labs and radiology results. BP 116/65 mmHg  Pulse 92  Temp(Src) 97.8 F (36.6 C) (Oral)  Resp 22  Ht 5\' 7"  (1.702 m)  Wt 322 lb 0.2 oz (146.064 kg)  BMI 50.42 kg/m2  SpO2 99% 12/02 0701 - 12/03 0700 In: 2886.7 [P.O.:460; I.V.:2426.7] Out: 950 [Urine:950] CBG (last 3)   Recent Labs  10/09/15 1802 10/09/15 2140 10/10/15 0200  GLUCAP 124* 111* 144*    General: alert, cooperative and mild distress GI: abnormal findings:  Obese, LLQ moderate tenderness to palpation Extremities: extremities normal, atraumatic, no cyanosis or edema, SCDs in place   Assessment/Plan: Large cystic left ovarian mass Continue oral Dilaudid for now; Ibuprofen added on to her regimen (admission Cr 0.65) Continue to monitor CBGs AC and qhs; continue Metformin and Glyburide To see Dr Denman George for surgery on [facility] 10/19/15 at 3 pm Plan to discharge to home tomorrow morning with family members who will be able to help her at home.   LOS: 3 days    Piper Hassebrock A 10/10/2015, 9:31 AM

## 2015-10-10 NOTE — Progress Notes (Signed)
Ativan 0.5mg /0.5cc IV given. Wasted in sink 1.5cc witnessed by Henrene Hawking.

## 2015-10-10 NOTE — Progress Notes (Signed)
Pt c/o pain rates pain 10 out 10. States it feels like something is stabbing in her abdomen. Pt crying states she needs some relief. Pt next dose of Dilaudid 4 mg po not due until 1230. Dr. Harolyn Rutherford notified. Orders given to give Motrin 600 mg PO and Ativan 0.5mg  IV. Will continue to monitor.

## 2015-10-11 ENCOUNTER — Encounter: Payer: Self-pay | Admitting: Obstetrics & Gynecology

## 2015-10-11 LAB — GLUCOSE, CAPILLARY
GLUCOSE-CAPILLARY: 105 mg/dL — AB (ref 65–99)
Glucose-Capillary: 106 mg/dL — ABNORMAL HIGH (ref 65–99)

## 2015-10-11 MED ORDER — IBUPROFEN 800 MG PO TABS: 800.0000 mg | ORAL_TABLET | Freq: Three times a day (TID) | ORAL | Status: AC | PRN

## 2015-10-11 MED ORDER — HYDROMORPHONE HCL 4 MG PO TABS: 4.0000 mg | ORAL_TABLET | ORAL | Status: DC | PRN

## 2015-10-11 NOTE — Plan of Care (Signed)
Problem: Tissue Perfusion: Goal: Risk factors for ineffective tissue perfusion will decrease Outcome: Completed/Met Date Met:  10/11/15 Wears O2 as needed and uses SCD hose while in bed.

## 2015-10-11 NOTE — Discharge Instructions (Signed)
Pelvic Mass A pelvic mass is an abnormal growth in the pelvis. The pelvis is the area between your hip bones. It includes the bladder and the rectum in males and females, and also the uterus and ovaries in females. CAUSES Many things can cause a pelvic mass, including:  Cancer.  Fibroids of the uterus.  Ovarian cysts.  Infection.  Ectopic pregnancy. SIGNS AND SYMPTOMS Symptoms of a pelvic mass may include:  Cramping.  Nausea.  Diarrhea.  Fever.  Vomiting.  Weakness.  Pain in the pelvis, side, or back.  Weight loss.  Constipation.  Problems with vaginal bleeding, including:  Light or heavy bleeding with or without blood clots.  Irregular menstruation.  Pain with menstruation.  Problems with urination, including:  Frequent urination.  Inability to empty the bladder completely.  Urinating very small amounts.  Pain with urination.  Bloody urine. Some pelvic masses do not cause symptoms. DIAGNOSIS To make a diagnosis, your health care provider will need to learn more about the mass. You may have tests or procedures done, such as:  Blood tests.  X-rays.  Ultrasound.  CT scan.  MRI.  A surgery to look inside of your abdomen with cameras (laparoscopy).  A biopsy that is performed with a needle or during laparoscopy or surgery. In some cases, what seemed like a pelvic mass may actually be something else, such as a mass in one of the organs that are near the pelvis, an infection (abscess) or scar tissue (adhesions) that formed after a surgery. TREATMENT Treatment will depend on the cause of the mass. HOME CARE INSTRUCTIONS What you need to do at home will depend on the cause of the mass. Follow the instructions that your health care provider gives to you. In general:  Keep all follow-up visits as directed by your health care provider. This is important.  Take medicines only as directed by your health care provider.  Follow any restrictions that  are given to you by your health care provider. SEEK MEDICAL CARE IF:  You develop new symptoms. SEEK IMMEDIATE MEDICAL CARE IF:  You vomit bright red blood or vomit material that looks like coffee grounds.  You have blood in your stools, or the stools turn black and tarry.  You have an abnormal or increased amount of vaginal bleeding.  You have a fever.  You develop easy bruising or bleeding.  You develop sudden or worsening pain that is not controlled by your medicine.  You feel worsening weakness, or you have a fainting episode.  You feel that the mass has suddenly gotten larger.  You develop severe bloating in your abdomen or your pelvis.  You cannot pass any urine.  You are unable to have a bowel movement.   This information is not intended to replace advice given to you by your health care provider. Make sure you discuss any questions you have with your health care provider.   Document Released: 01/31/2007 Document Revised: 11/14/2014 Document Reviewed: 06/09/2014 Elsevier Interactive Patient Education 2016 [employer]  

## 2015-10-11 NOTE — Progress Notes (Signed)
D/c instructions and prescriptions reviewed with patient. Pt has a f/u appt with MD on 12-12 3pm. Pt d/c home, stable, with family to private car.

## 2015-10-11 NOTE — Discharge Summary (Signed)
Physician Discharge Summary  Patient ID: Martha Schwartz MRN: [id number] DOB/AGE: July 21, 1961 54 y.o.  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 CA-125 was 10.  She was seen by Dr. Everitt Amber (GYN ONC) who felt that surgery was not emergently needed, and will have patient follow up as outpatient for further discussion about management.   It was initially difficult to manage her pain; patient has a history of chronic pain with chronic opioid dependency and is currently on narcotics under the care of a pain clinic.  She was eventually managed on Dilaudid 4 mg po q4h prn pain and Ibuprofen 800 mg po q8h prn pain.  Patient was discharged to home on 10/11/2015, will follow up as scheduled with primary GYN (Dr. Micah Noel) or GYN Oncology.  Consults: Gynecology Oncology  Significant Diagnostic Studies:  Results for orders placed or performed during the hospital encounter of 10/07/15 (from the past 168 hour(s))  Urinalysis, Routine w reflex microscopic (not at [facility])   Collection Time: 10/07/15  1:30 PM  Result Value Ref Range   Color, Urine YELLOW YELLOW   APPearance TURBID (A) CLEAR   Specific Gravity, Urine 1.018 1.005 - 1.030   pH 8.0 5.0 - 8.0   Glucose, UA NEGATIVE NEGATIVE mg/dL   Hgb urine dipstick NEGATIVE NEGATIVE   Bilirubin Urine NEGATIVE NEGATIVE   Ketones, ur NEGATIVE NEGATIVE mg/dL   Protein, ur NEGATIVE NEGATIVE mg/dL   Nitrite NEGATIVE NEGATIVE   Leukocytes, UA SMALL (A) NEGATIVE  Pregnancy, urine   Collection Time: 10/07/15  1:30 PM  Result Value Ref Range   Preg Test, Ur NEGATIVE NEGATIVE  Urine microscopic-add on   Collection Time: 10/07/15  1:30 PM  Result Value Ref Range   Squamous Epithelial / LPF 0-5 (A) NONE SEEN   WBC, UA 0-5 0 - 5  WBC/hpf   RBC / HPF 0-5 0 - 5 RBC/hpf   Bacteria, UA RARE (A) NONE SEEN   Urine-Other AMORPHOUS URATES/PHOSPHATES   Comprehensive metabolic panel   Collection Time: 10/07/15  2:15 PM  Result Value Ref Range   Sodium 137 135 - 145 mmol/L   Potassium 3.7 3.5 - 5.1 mmol/L   Chloride 103 101 - 111 mmol/L   CO2 25 22 - 32 mmol/L   Glucose, Bld 164 (H) 65 - 99 mg/dL   BUN 15 6 - 20 mg/dL   Creatinine, Ser 0.75 0.44 - 1.00 mg/dL   Calcium 9.5 8.9 - 10.3 mg/dL   Total Protein 7.1 6.5 - 8.1 g/dL   Albumin 3.7 3.5 - 5.0 g/dL   AST 36 15 - 41 U/L   ALT 33 14 - 54 U/L   Alkaline Phosphatase 80 38 - 126 U/L   Total Bilirubin 0.7 0.3 - 1.2 mg/dL   GFR calc non Af Amer >60 >60 mL/min   GFR calc Af Amer >60 >60 mL/min   Anion gap 9 5 - 15  CBC   Collection Time: 10/07/15  2:15 PM  Result Value Ref Range   WBC 8.9 4.0 - 10.5 K/uL   RBC 4.33 3.87 - 5.11 MIL/uL   Hemoglobin 12.8 12.0 - 15.0 g/dL   HCT 38.7 36.0 - 46.0 %   MCV 89.4 78.0 - 100.0 fL   MCH 29.6 26.0 - 34.0 pg  MCHC 33.1 30.0 - 36.0 g/dL   RDW 13.5 11.5 - 15.5 %   Platelets 253 150 - 400 K/uL  Lipase, blood   Collection Time: 10/07/15  2:15 PM  Result Value Ref Range   Lipase 21 11 - 51 U/L  Differential   Collection Time: 10/07/15  2:15 PM  Result Value Ref Range   Neutrophils Relative % 73 %   Neutro Abs 6.6 1.7 - 7.7 K/uL   Lymphocytes Relative 17 %   Lymphs Abs 1.5 0.7 - 4.0 K/uL   Monocytes Relative 7 %   Monocytes Absolute 0.6 0.1 - 1.0 K/uL   Eosinophils Relative 2 %   Eosinophils Absolute 0.2 0.0 - 0.7 K/uL   Basophils Relative 1 %   Basophils Absolute 0.1 0.0 - 0.1 K/uL  I-Stat CG4 Lactic Acid, ED   Collection Time: 10/07/15  2:30 PM  Result Value Ref Range   Lactic Acid, Venous 2.12 (HH) 0.5 - 2.0 mmol/L   Comment NOTIFIED PHYSICIAN   MRSA PCR Screening   Collection Time: 10/08/15  3:05 AM  Result Value Ref Range   MRSA by PCR POSITIVE (A) NEGATIVE  Comprehensive metabolic panel   Collection Time:  10/08/15  5:25 AM  Result Value Ref Range   Sodium 134 (L) 135 - 145 mmol/L   Potassium 3.9 3.5 - 5.1 mmol/L   Chloride 99 (L) 101 - 111 mmol/L   CO2 27 22 - 32 mmol/L   Glucose, Bld 139 (H) 65 - 99 mg/dL   BUN 17 6 - 20 mg/dL   Creatinine, Ser 0.65 0.44 - 1.00 mg/dL   Calcium 9.0 8.9 - 10.3 mg/dL   Total Protein 6.6 6.5 - 8.1 g/dL   Albumin 3.6 3.5 - 5.0 g/dL   AST 27 15 - 41 U/L   ALT 30 14 - 54 U/L   Alkaline Phosphatase 75 38 - 126 U/L   Total Bilirubin 0.7 0.3 - 1.2 mg/dL   GFR calc non Af Amer >60 >60 mL/min   GFR calc Af Amer >60 >60 mL/min   Anion gap 8 5 - 15  TSH   Collection Time: 10/08/15  5:25 AM  Result Value Ref Range   TSH 3.211 0.350 - 4.500 uIU/mL  CA 125   Collection Time: 10/08/15  5:45 AM  Result Value Ref Range   CA 125 10.0 0.0 - 38.1 U/mL  Hemoglobin A1c   Collection Time: 10/08/15  5:45 AM  Result Value Ref Range   Hgb A1c MFr Bld 6.3 (H) 4.8 - 5.6 %   Mean Plasma Glucose 134 mg/dL  Type and screen [place]   Collection Time: 10/08/15  5:45 AM  Result Value Ref Range   ABO/RH(D) O POS    Antibody Screen NEG    Sample Expiration 10/11/2015   ABO/Rh   Collection Time: 10/08/15  5:45 AM  Result Value Ref Range   ABO/RH(D) O POS   Follicle stimulating hormone   Collection Time: 10/08/15  9:56 AM  Result Value Ref Range   FSH 26.7 mIU/mL  Glucose, capillary   Collection Time: 10/08/15  2:12 PM  Result Value Ref Range   Glucose-Capillary 120 (H) 65 - 99 mg/dL  Urine rapid drug screen (hosp performed)   Collection Time: 10/08/15  4:15 PM  Result Value Ref Range   Opiates POSITIVE (A) NONE DETECTED   Cocaine NONE DETECTED NONE DETECTED   Benzodiazepines POSITIVE (A) NONE DETECTED   Amphetamines NONE DETECTED NONE  DETECTED   Tetrahydrocannabinol NONE DETECTED NONE DETECTED   Barbiturates NONE DETECTED NONE DETECTED  Alcohol metabolite (ETG), urine   Collection Time: 10/08/15  4:15 PM  Result Value Ref Range   Ethyl  Glucuronide (EtG) NEGATIVE Cutoff:500 ng/mL  Glucose, capillary   Collection Time: 10/08/15  6:59 PM  Result Value Ref Range   Glucose-Capillary 124 (H) 65 - 99 mg/dL  Glucose, capillary   Collection Time: 10/08/15 11:10 PM  Result Value Ref Range   Glucose-Capillary 107 (H) 65 - 99 mg/dL  Glucose, capillary   Collection Time: 10/09/15  2:28 AM  Result Value Ref Range   Glucose-Capillary 127 (H) 65 - 99 mg/dL  Glucose, capillary   Collection Time: 10/09/15  6:07 AM  Result Value Ref Range   Glucose-Capillary 121 (H) 65 - 99 mg/dL  Glucose, capillary   Collection Time: 10/09/15 10:35 AM  Result Value Ref Range   Glucose-Capillary 109 (H) 65 - 99 mg/dL   Comment 1 Notify RN   Glucose, capillary   Collection Time: 10/09/15  2:45 PM  Result Value Ref Range   Glucose-Capillary 117 (H) 65 - 99 mg/dL  Glucose, capillary   Collection Time: 10/09/15  6:02 PM  Result Value Ref Range   Glucose-Capillary 124 (H) 65 - 99 mg/dL  Glucose, capillary   Collection Time: 10/09/15  9:40 PM  Result Value Ref Range   Glucose-Capillary 111 (H) 65 - 99 mg/dL  Glucose, capillary   Collection Time: 10/10/15  2:00 AM  Result Value Ref Range   Glucose-Capillary 144 (H) 65 - 99 mg/dL  Glucose, capillary   Collection Time: 10/10/15  6:03 AM  Result Value Ref Range   Glucose-Capillary 120 (H) 65 - 99 mg/dL  Glucose, capillary   Collection Time: 10/10/15  2:58 PM  Result Value Ref Range   Glucose-Capillary 102 (H) 65 - 99 mg/dL  Glucose, capillary   Collection Time: 10/10/15  6:16 PM  Result Value Ref Range   Glucose-Capillary 129 (H) 65 - 99 mg/dL   Comment 1 Notify RN    Comment 2 Document in Chart   Glucose, capillary   Collection Time: 10/11/15  6:34 AM  Result Value Ref Range   Glucose-Capillary 105 (H) 65 - 99 mg/dL  Glucose, capillary   Collection Time: 10/11/15  8:34 AM  Result Value Ref Range   Glucose-Capillary 106 (H) 65 - 99 mg/dL   Comment 1 Notify RN    Comment 2 Document  in Chart    US Transvaginal Non-ob  10/07/2015  CLINICAL DATA:  Sudden onset left adnexal pain. EXAM: TRANSABDOMINAL AND TRANSVAGINAL ULTRASOUND OF PELVIS TECHNIQUE: Both transabdominal and transvaginal ultrasound examinations of the pelvis were performed. Transabdominal technique was performed for global imaging of the pelvis including uterus, ovaries, adnexal regions, and pelvic cul-de-sac. It was necessary to proceed with endovaginal exam following the transabdominal exam to visualize the uterus and ovaries. COMPARISON:  CT abdomen pelvis October 07, 2015 FINDINGS: Uterus Measurements: 7 x 3.4 x 4.1 cm. There is generalized heterogeneity of myometrium. Endometrium Thickness: 8 mm.  The endometrium is heterogeneous. Right ovary Not definitely seen. Left ovary There is a large complex cystic mass in the left adnexa measuring 22 x 18 x 23 cm. Positive color, arterial and venous flow are identified in the soft tissues adjacent to the large complex mass. Other findings No free fluid. IMPRESSION: Large complex cystic mass in the left adnexa measuring 22 x 18 x 23 cm. This correlates to the mass  seen on recent CT. 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 Normal-sized uterus with generalized heterogeneity of the myometrium. This is nonspecific but can be seen in myomatous infiltration. The endometrium is a heterogeneous and measures 8 mm. If the patient is postmenopausal, the endometrium is abnormally thickened. Electronically Signed   By: Abelardo Diesel M.D.   On: 10/07/2015 18:17   US Pelvis Complete  10/07/2015  CLINICAL DATA:  Sudden onset left adnexal pain. EXAM: TRANSABDOMINAL AND TRANSVAGINAL ULTRASOUND OF PELVIS TECHNIQUE: Both transabdominal and transvaginal ultrasound examinations of the pelvis were performed. Transabdominal technique was performed for global imaging of the pelvis including uterus, ovaries, adnexal regions, and pelvic cul-de-sac. It was necessary to proceed with endovaginal  exam following the transabdominal exam to visualize the uterus and ovaries. COMPARISON:  CT abdomen pelvis October 07, 2015 FINDINGS: Uterus Measurements: 7 x 3.4 x 4.1 cm. There is generalized heterogeneity of myometrium. Endometrium Thickness: 8 mm.  The endometrium is heterogeneous. Right ovary Not definitely seen. Left ovary There is a large complex cystic mass in the left adnexa measuring 22 x 18 x 23 cm. Positive color, arterial and venous flow are identified in the soft tissues adjacent to the large complex mass. Other findings No free fluid. IMPRESSION: Large complex cystic mass in the left adnexa measuring 22 x 18 x 23 cm. This correlates to the mass seen on recent CT. 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 Normal-sized uterus with generalized heterogeneity of the myometrium. This is nonspecific but can be seen in myomatous infiltration. The endometrium is a heterogeneous and measures 8 mm. If the patient is postmenopausal, the endometrium is abnormally thickened. Electronically Signed   By: Abelardo Diesel M.D.   On: 10/07/2015 18:17   Ct Abdomen Pelvis W Contrast  10/07/2015  CLINICAL DATA:  55 year old female with left lower quadrant pain and nausea since this morning. Initial encounter. EXAM: CT ABDOMEN AND PELVIS WITH CONTRAST TECHNIQUE: Multidetector CT imaging of the abdomen and pelvis was performed using the standard protocol following bolus administration of intravenous contrast. CONTRAST:  46mL OMNIPAQUE IOHEXOL 300 MG/ML SOLN, 157mL OMNIPAQUE IOHEXOL 300 MG/ML SOLN COMPARISON:  [facility] CT Abdomen and Pelvis 08/25/2011. FINDINGS: Cardiomegaly and mediastinal lipomatosis. Negative lung bases. No pericardial or pleural effusion. Large body habitus. Degenerative changes throughout the spine. No acute osseous abnormality identified. In the pelvis there is no free fluid. The rectum is decompressed. Urinary bladder is diminutive. The uterus is not enlarged. However,  inseparable from both adnexa (left series 3, image 77 and right image 72) is a very large lobulated fluid density (9 Hounsfield units) mass encompassing 14.7 x 18.1 x 20.4 cm (AP by transverse by CC). This projects out of the pelvis into the lower abdomen above the level of the emboli kiss (sagittal image 72). In 2012 7.8 cm simple appearing left adnexal cyst was present. The right adnexal was normal at that time. Major arterial structures in the abdomen and pelvis are patent. Portal venous system is patent. No lymphadenopathy in the abdomen or pelvis. Mild mass effect on the sigmoid colon from the large mass which otherwise appears normal. Redundant hepatic flexures with retained stool throughout the more proximal colon. Negative appendix and terminal ileum. No dilated small bowel. No abdominal free fluid. Oral contrast in the stomach and duodenum which appear normal. Liver, gallbladder, spleen, pancreas, adrenal glands, and kidneys are within normal limits. IMPRESSION: 1. Very large (20 cm) cystic pelvic mass inseparable from both ovaries and consistent with  cystic ovarian neoplasm. CT appearance is unilocular, with no associated ascites or metastatic disease identified. 2. Otherwise negative abdomen and pelvis. Electronically Signed   By: Genevie Ann M.D.   On: 10/07/2015 16:03    Treatments: Analgesia: Dilaudid and Ibuprofen  Discharge Exam: Blood pressure 95/55, pulse 101, temperature 97.8 F (36.6 C), temperature source Oral, resp. rate 16, height 5\' 7"  (1.702 m), weight 322 lb 0.2 oz (146.064 kg), SpO2 96 %. General: alert, cooperative and mild distress Lungs: Normal breath sounds Heart: regular heart rate GI: abnormal findings: Obese, LLQ moderate tenderness to palpation Extremities: extremities normal, atraumatic, no cyanosis or edema, SCDs in place  Disposition: 01-Home or Self Care     Medication List    TAKE these medications        albuterol 108 (90 BASE) MCG/ACT inhaler  Commonly  known as:  PROVENTIL HFA;VENTOLIN HFA  Inhale 2 puffs into the lungs every 6 (six) hours as needed for wheezing or shortness of breath.     ALPRAZolam 1 MG tablet  Commonly known as:  XANAX  Take 1 mg by mouth at bedtime as needed for sleep.     amLODipine 10 MG tablet  Commonly known as:  NORVASC  Take 10 mg by mouth daily.     docusate sodium 100 MG capsule  Commonly known as:  COLACE  Take 100 mg by mouth 2 (two) times daily.     DULoxetine 60 MG capsule  Commonly known as:  CYMBALTA  Take 120 mg by mouth daily.     gabapentin 600 MG tablet  Commonly known as:  NEURONTIN  Take 600 mg by mouth 3 (three) times daily.     hydrocortisone 2.5 % cream  Apply 1 application topically daily.     HYDROmorphone 4 MG tablet  Commonly known as:  DILAUDID  Take 1 tablet (4 mg total) by mouth every 4 (four) hours as needed for moderate pain or severe pain.     ibuprofen 800 MG tablet  Commonly known as:  ADVIL,MOTRIN  Take 1 tablet (800 mg total) by mouth 3 (three) times daily as needed for fever, headache or moderate pain.     ibuprofen 200 MG tablet  Commonly known as:  ADVIL,MOTRIN  Take 800 mg by mouth every 6 (six) hours as needed for pain.     levothyroxine 50 MCG tablet  Commonly known as:  SYNTHROID, LEVOTHROID  Take 50 mcg by mouth daily before breakfast.     lisinopril-hydrochlorothiazide 20-25 MG tablet  Commonly known as:  PRINZIDE,ZESTORETIC  Take 1 tablet by mouth daily.     lubiprostone 24 MCG capsule  Commonly known as:  AMITIZA  Take 24 mcg by mouth 2 (two) times daily with a meal.     meloxicam 15 MG tablet  Commonly known as:  MOBIC  Take 15 mg by mouth daily.     metFORMIN 500 MG tablet  Commonly known as:  GLUCOPHAGE  Take 500 mg by mouth 2 (two) times daily with a meal.     methocarbamol 750 MG tablet  Commonly known as:  ROBAXIN  Take 750 mg by mouth 4 (four) times daily as needed for muscle spasms.     metoprolol succinate 25 MG 24 hr tablet   Commonly known as:  TOPROL-XL  Take 1 tablet (25 mg total) by mouth daily. *Patient needs appointment for further refills*     mirtazapine 15 MG tablet  Commonly known as:  REMERON  Take 7.5-15 mg  by mouth at bedtime as needed (For sleep.).     omeprazole 40 MG capsule  Commonly known as:  PRILOSEC  Take 40 mg by mouth daily.     oxybutynin 5 MG tablet  Commonly known as:  DITROPAN  Take 5 mg by mouth daily.     oxyCODONE 15 MG immediate release tablet  Commonly known as:  ROXICODONE  Take 15 mg by mouth 4 (four) times daily as needed for pain.     potassium chloride SA 20 MEQ tablet  Commonly known as:  K-DUR,KLOR-CON  Take 20 mEq by mouth daily.     simvastatin 20 MG tablet  Commonly known as:  ZOCOR  Take 20 mg by mouth daily at 6 PM.           Follow-up Information    Follow up with Donaciano Eva, MD On 10/19/2015.   Specialty:  Obstetrics and Gynecology   Why:  3 pm for appointment.   Contact information:   [place] [address] [phone]       Signed: Osborne Oman 10/11/2015, 9:03 AM

## 2015-10-12 ENCOUNTER — Encounter (HOSPITAL_COMMUNITY)
Admission: EM | Disposition: A | Discharge status: Home / Self Care | Payer: Self-pay | Source: Home / Self Care | Attending: Obstetrics & Gynecology

## 2015-10-12 SURGERY — LAPAROTOMY, EXPLORATORY
Anesthesia: General

## 2015-10-19 ENCOUNTER — Encounter: Payer: Self-pay | Admitting: Gynecologic Oncology

## 2015-10-19 ENCOUNTER — Other Ambulatory Visit: Payer: Medicare Other

## 2015-10-19 ENCOUNTER — Ambulatory Visit: Payer: Medicare Other | Attending: Gynecologic Oncology | Admitting: Gynecologic Oncology

## 2015-10-19 VITALS — BP 130/81 | HR 104 | Temp 97.6°F | Resp 18 | Ht 67.5 in | Wt 324.1 lb

## 2015-10-19 DIAGNOSIS — R1900 Intra-abdominal and pelvic swelling, mass and lump, unspecified site: Secondary | ICD-10-CM | POA: Diagnosis present

## 2015-10-19 DIAGNOSIS — L98499 Non-pressure chronic ulcer of skin of other sites with unspecified severity: Secondary | ICD-10-CM

## 2015-10-19 DIAGNOSIS — E08622 Diabetes mellitus due to underlying condition with other skin ulcer: Secondary | ICD-10-CM

## 2015-10-19 DIAGNOSIS — E08628 Diabetes mellitus due to underlying condition with other skin complications: Secondary | ICD-10-CM | POA: Insufficient documentation

## 2015-10-19 NOTE — Patient Instructions (Signed)
We will call you with the results of your lab work from today.                  Preparing for your Surgery  Plan for surgery on Jan 26 with Dr. Denman George.  You will be scheduled for an exploratory laparotomy, bilateral salpingo-oophorectomy (removal of tubes and ovaries).    Pre-operative Testing -You will receive a phone call from presurgical testing at [facility] to arrange for a pre-operative testing appointment before your surgery.  This appointment normally occurs one to two weeks before your scheduled surgery.   -Bring your insurance card, copy of an advanced directive if applicable, medication list  -At that visit, you will be asked to sign a consent for a possible blood transfusion in case a transfusion becomes necessary during surgery.  The need for a blood transfusion is rare but having consent is a necessary part of your care.     -You should not be taking blood thinners or aspirin at least ten days prior to surgery unless instructed by your surgeon.  Day Before Surgery at [place] will be asked to take in only clear liquids the day before surgery.  Examples of clear liquids include broths, jello, and clear juices.  Avoid carbonated beverages.  You will be advised to have nothing to eat or drink after midnight the evening before.    Your role in recovery Your role is to become active as soon as directed by your doctor, while still giving yourself time to heal.  Rest when you feel tired. You will be asked to do the following in order to speed your recovery:  - Cough and breathe deeply. This helps toclear and expand your lungs and can prevent pneumonia. You may be given a spirometer to practice deep breathing. A staff member will show you how to use the spirometer. - Do mild physical activity. Walking or moving your legs help your circulation and body functions return to normal. A staff member will help you when you try to walk and will provide you with simple exercises. Do not  try to get up or walk alone the first time. - Actively manage your pain. Managing your pain lets you move in comfort. We will ask you to rate your pain on a scale of zero to 10. It is your responsibility to tell your doctor or nurse where and how much you hurt so your pain can be treated.  Special Considerations -If you are diabetic, you may be placed on insulin after surgery to have closer control over your blood sugars to promote healing and recovery.  This does not mean that you will be discharged on insulin.  If applicable, your oral antidiabetics will be resumed when you are tolerating a solid diet.  -Your final pathology results from surgery should be available by the Friday after surgery and the results will be relayed to you when available.  Blood Transfusion Information WHAT IS A BLOOD TRANSFUSION? A transfusion is the replacement of blood or some of its parts. Blood is made up of multiple cells which provide different functions.  Red blood cells carry oxygen and are used for blood loss replacement.  White blood cells fight against infection.  Platelets control bleeding.  Plasma helps clot blood.  Other blood products are available for specialized needs, such as hemophilia or other clotting disorders. BEFORE THE TRANSFUSION  Who gives blood for transfusions?   You may be able to donate blood to be  used at a later date on yourself (autologous donation).  Relatives can be asked to donate blood. This is generally not any safer than if you have received blood from a stranger. The same precautions are taken to ensure safety when a relative's blood is donated.  Healthy volunteers who are fully evaluated to make sure their blood is safe. This is blood bank blood. Transfusion therapy is the safest it has ever been in the practice of medicine. Before blood is taken from a donor, a complete history is taken to make sure that person has no history of diseases nor engages in risky social  behavior (examples are intravenous drug use or sexual activity with multiple partners). The donor's travel history is screened to minimize risk of transmitting infections, such as malaria. The donated blood is tested for signs of infectious diseases, such as HIV and hepatitis. The blood is then tested to be sure it is compatible with you in order to minimize the chance of a transfusion reaction. If you or a relative donates blood, this is often done in anticipation of surgery and is not appropriate for emergency situations. It takes many days to process the donated blood. RISKS AND COMPLICATIONS Although transfusion therapy is very safe and saves many lives, the main dangers of transfusion include:   Getting an infectious disease.  Developing a transfusion reaction. This is an allergic reaction to something in the blood you were given. Every precaution is taken to prevent this. The decision to have a blood transfusion has been considered carefully by your caregiver before blood is given. Blood is not given unless the benefits outweigh the risks.

## 2015-10-19 NOTE — Progress Notes (Signed)
GYN ONC FOLLOWUP  Consult was originally requested as an inpatient consult by Dr. Leggett for the evaluation of Martha Schwartz 54 y.o. female with a pelvic mass  CC:  Chief Complaint  Patient presents with  . Abdominal Pain    Assessment/Plan:  Ms. Martha Schwartz is a 54 y.o. year old with a 23cm multi-cystic central pelvic mass.  I have personally viewed her CT scan from 10/07/15 and showed the patient these images today. I have a low suspicion for malignancy based on the appearance of this mass on imaging. She has a history of a smaller (7cm) mass 4 years ago in the left adnexa, and this is likely the same lesion that has grown slowly over time. If this were to be a malignancy, I do not expect that the patient would have these findings or relatively indolent course in growth of the mass over time. Her scan shows no ascites, adenopathy or peritoneal disease. CA 125 is normal at 10.  She states that she is in severe pain and is using oral dilaudid since discharge.   Given her extremely significant medical comorbidities (morbid obesity, COPD, cardiac disease, diabetes, MRSA colonization) she is at a very high surgical risk for major complications, and therefore optimization should take place prior to a surgical effort given that there is no sign of an emergent indication.  I am recommending she see her PCP, Dr Irving this month, and after optimization of her cardio-pulmonary and endocrine issues has taken place she will be considered for surgery with me on January 26th. We will plan to perform an ex lap, BSO.   I discussed the considerable increased risks she faces (due to her underlying comorbidities and obesity) with surgery including bleeding, infection, damage to internal organs (such as bladder,ureters, bowels), blood clot, reoperation and rehospitalization. She will be at a high risk for wound failure (separation, infection) requiring healing by secondary intention. Due to her  COPD she is at increased risk for prolonged ventilation.  We will check HbA1C and if >7% will delay surgery as this substantially increases her risk.  The patient has a history of chronic opioid dependency and has a pain contract with a pain clinic ([facility] in [place], [place]).. She takes oxy 15mg q 3-4 times per day for knee pain. I discussed that postoperatively I will only prescribe analgesia for 6 weeks, after which time she will require ongoing analgesic needs met by her chronic pain team.  HPI: Martha Schwartz is a 54 year old woman who was seen in consultation at the request of Dr Leggett for a 23cm central ovarian mass.  She was admitted to [facility] on 10/07/15 with acute onset of severe abdominal pain, relieved only by IV dilaudid.  She has a past medical history significant for extreme morbid obesity, MRSA positivity and chronic LE wound infections, DM, cardiac disease, COPD. She has chronic pain, and is a chronic opioid user.  CT scan of the abdo/pelvis was performed on 10/07/15 and showed: The uterus is not enlarged. However, inseparable from both adnexa (left series 3, image 77 and right image 72) is a very large lobulated fluid density (9 Hounsfield units) mass encompassing 14.7 x 18.1 x 20.4 cm (AP by transverse by CC). This projects out of the pelvis into the lower abdomen above the level of the emboli kiss (sagittal image 72). In 2012 7.8 cm simple appearing left adnexal cyst was present. The right adnexal was normal at that time.  CA 125   was 10.  She was evaluated and discharged to home on oral dilaudid. Since discharge her pain is stable.  Current Meds:  No current facility-administered medications on file prior to encounter.   Current Outpatient Prescriptions on File Prior to Encounter  Medication Sig Dispense Refill  . docusate sodium (COLACE) 100 MG capsule Take 100 mg by mouth 2 (two) times daily.    .  hydrocortisone 2.5 % cream Apply 1 application topically daily.    . ibuprofen (ADVIL,MOTRIN) 200 MG tablet Take 800 mg by mouth every 6 (six) hours as needed for pain.     . metoprolol succinate (TOPROL-XL) 25 MG 24 hr tablet Take 1 tablet (25 mg total) by mouth daily. *Patient needs appointment for further refills* 30 tablet 0  . lisinopril-hydrochlorothiazide (PRINZIDE,ZESTORETIC) 20-25 MG per tablet Take 1 tablet by mouth daily.       Allergy:  Allergies  Allergen Reactions  . Sulfa Antibiotics Rash    Social Hx:  Social History   Social History  . Marital Status: Single    Spouse Name: N/A  . Number of Children: N/A  . Years of Education: N/A   Occupational History  . Not on file.   Social History Main Topics  . Smoking status: Former Smoker    Types: Cigarettes  . Smokeless tobacco: Current User    Types: Snuff  . Alcohol Use: No  . Drug Use: No  . Sexual Activity: Not on file   Other Topics Concern  . Not on file   Social History Narrative    Past Surgical Hx:  Past Surgical History  Procedure Laterality Date  . Back surgery    . Knee surgery      Past Medical Hx:  Past Medical History  Diagnosis Date  . Hypertension   . Asthma   . GERD (gastroesophageal reflux disease)   . Anxiety   . Thyroid disease   . Crack cocaine use     last use per pt 3 years ago  . Diabetes mellitus without complication (HCC)   . Depressed   . 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 Chronic pain disorder     on oxycodone 15 mg 3-4 times a day    Past Gynecological History: No LMP recorded. Patient is not currently having periods (Reason:  Perimenopausal).  Family Hx: History reviewed. No pertinent family history.  Review of Systems:  Constitutional  Feels well,  ENT Normal appearing ears and nares bilaterally Skin/Breast  No rash, sores, jaundice, itching, dryness Cardiovascular  No chest pain, shortness of breath, or edema  Pulmonary  No cough or wheeze.  Gastro Intestinal  No nausea, vomitting, or diarrhoea. No bright red blood per rectum, no abdominal pain, change in bowel movement, or constipation.  Genito Urinary  No frequency, urgency, dysuria, no bleeding Musculo Skeletal  No myalgia, arthralgia, joint swelling or pain  Neurologic  No weakness, numbness, change in gait,  Psychology  No depression, anxiety, insomnia.   Vitals: Blood pressure 111/72, pulse 84, temperature 97.4 F (36.3 C), temperature source Oral, resp. rate 14, height 5' 7" (1.702 m), weight 322 lb 0.2 oz (146.064 kg), SpO2 97 %.  Physical Exam: WD in NAD Neck  Supple NROM, without any enlargements.  Lymph Node Survey No cervical supraclavicular or inguinal adenopathy Cardiovascular  Pulse normal rate, regularity and rhythm. S1 and S2 normal.  Lungs  Clear to auscultation bilateraly, without wheezes/crackles/rhonchi. Good   air movement.  Skin  No rash/lesions/breakdown  Psychiatry  Alert and oriented to person, place, and time  Abdomen  Normoactive bowel sounds, abdomen soft, non-tender and very obese without evidence of hernia. Firmness in left mid abdomen consistent with contour of cystic mass. Back No CVA tenderness Genito Urinary  Normal external female genitalia. Normal BUS and skenes. Normal vagina. Palpably normal cervix. Uterus small and difficult to appreciate due to body habitus. Mass not discretely palpable. Extremities  No bilateral cyanosis, clubbing or edema.           Teshara Moree Caroline, MD  

## 2015-10-20 ENCOUNTER — Telehealth: Payer: Self-pay | Admitting: *Deleted

## 2015-10-20 LAB — HEMOGLOBIN A1C
Hgb A1c MFr Bld: 6.2 % — ABNORMAL HIGH (ref <5.7)
Mean Plasma Glucose: 131 mg/dL — ABNORMAL HIGH (ref <117)

## 2015-10-20 NOTE — Telephone Encounter (Signed)
[facility]- Dr. Charna Archer Internal Medicine, Family Medicine, Sleep Medicine  [address] [place], [place] [address]  [phone] Fax [phone]  Call placed to pt's PCP above. Requested medical clearance for upcoming surgery.   Last progress note on pt faxed to Dr. Bill Salinas office. Pt has an appt with Hubbard Robinson, PA on dec 22. RN at Dr. Bill Salinas office advised letter for clearance will be faxed to our office that week. No further concerns.

## 2015-11-04 ENCOUNTER — Telehealth: Payer: Self-pay

## 2015-11-04 NOTE — Telephone Encounter (Signed)
Follow up call placed to Dr Bill Salinas office [facility] : [phone]  Fax : [phone]  with a second request for medical clearance for upcoming surgery scheduled on January 26 , 2017 . Writer was informed that the patient had cancelled her appointment with Hubbard Robinson , PA on Dec 22 , 2017 . Attempted to contact the patient , no answer , left a detailed message with the importance of keeping her scheduled appointment on January 10 , 2017 with Hubbard Robinson , PA for medical clearance for upcoming surgery on 12/03/2015 with Dr Everitt Amber . Call back information given if additional questions , concerns or changes arise .

## 2015-11-04 NOTE — Telephone Encounter (Signed)
Patient called to state she will keep her appointment with Hubbard Robinson PA on January 10 , 2017 for medical clearance for upcoming surgery.

## 2015-11-10 ENCOUNTER — Telehealth: Payer: Self-pay

## 2015-11-10 NOTE — Telephone Encounter (Signed)
Incoming call , patient requesting refill on "Dilaudid" 4 MG PO every 4 hours PRN . Patient states she is having "bad" pain to side and she needs a refill. Writer updated Joylene John, APNP of the patient's request. Refill denied , [facility] . Writer informed the patient of denial and it was also suggested she contact Dr Verita Schneiders office , as he filled the last prescription for Dilaudid 4 MG PO every 4 hours as needed for pain , Qty 90 on 10/11/2015 . Patient states she did call the pain clinic and her request was denied , patient states she will contact the [facility] and get a refill from them. Patient denies further questions at this time and thanked Probation officer for her time.

## 2015-11-23 ENCOUNTER — Telehealth: Payer: Self-pay

## 2015-11-23 NOTE — Telephone Encounter (Signed)
Follow up call placed to Dr Lindwood Qua office [facility]: [phone] , Fax : [phone] to see if the patient was seen on January 12 , 2017 for surgical clearance . Spoke with Dr Jeannetta Ellis nurse , patient follow through with her appointment . MD progress note along with lab results will be faxed to our office , fax number provided.

## 2015-11-24 ENCOUNTER — Telehealth: Payer: Self-pay

## 2015-11-24 NOTE — Telephone Encounter (Signed)
Third attempt to obtain surgical clearance from Dr Holley Raring [facility]: [phone] , Fax : [phone] , left a detailed message on Dr Lindwood Qua nurses line . Call back requested to ensure communication would remain open , all contcat information was given.

## 2015-11-25 ENCOUNTER — Telehealth: Payer: Self-pay

## 2015-11-25 NOTE — Telephone Encounter (Signed)
Patient contacted and updated that we received Medical Clearance for her scheduled surgery on January 26 ,2017 with Dr Everitt Amber . No answer , left a detailed with Medical clearance , call back information provided if additional questions arise.

## 2015-11-25 NOTE — Patient Instructions (Addendum)
Martha Schwartz  11/25/2015   Your procedure is scheduled on:   Thursday 12/03/2015  Report to [facility] Main  Entrance take [place]  elevators to 3rd floor to  [place] at   [place]  AM.  Call this number if you have problems the morning of surgery [phone]   Remember: ONLY 1 PERSON MAY GO WITH YOU TO SHORT STAY TO GET  READY MORNING OF [place].   Do not eat food or drink liquids :After Midnight.   FOLLOW A CLEAR LIQUID DIET THE DAY BEFORE SURGERY ALL DAY BUT DO NOT DRINK ANY CARBONATED LIQUIDS! SEE LIST BELOW!   Take these medicines the morning of surgery with A SIP OF WATER:Amlodipine, Cymbalta, Gabapentin, Levothyroxine, Metoprolol, use Albuterol inhaler if needed               DO NOT TAKE ANY DIABETIC MEDICATIONS DAY OF YOUR SURGERY                               You may not have any metal on your body including hair pins and              piercings  Do not wear jewelry, make-up, lotions, powders or perfumes, deodorant             Do not wear nail polish.  Do not shave  48 hours prior to surgery.              Men may shave face and neck.   Do not bring valuables to the hospital. [place].  Contacts, dentures or bridgework may not be worn into surgery.  Leave suitcase in the car. After surgery it may be brought to your room.     Patients discharged the day of surgery will not be allowed to drive home.  Name and phone number of your driver:  Special Instructions: N/A              Please read over the following fact sheets you were given: _____________________________________________________________________             [facility] - Preparing for Surgery Before surgery, you can play an important role.  Because skin is not sterile, your skin needs to be as free of germs as possible.  You can reduce the number of germs on your skin by washing with CHG (chlorahexidine gluconate) soap before  surgery.  CHG is an antiseptic cleaner which kills germs and bonds with the skin to continue killing germs even after washing. Please DO NOT use if you have an allergy to CHG or antibacterial soaps.  If your skin becomes reddened/irritated stop using the CHG and inform your nurse when you arrive at Short Stay. Do not shave (including legs and underarms) for at least 48 hours prior to the first CHG shower.  You may shave your face/neck. Please follow these instructions carefully:  1.  Shower with CHG Soap the night before surgery and the  morning of Surgery.  2.  If you choose to wash your hair, wash your hair first as usual with your  normal  shampoo.  3.  After you shampoo, rinse your hair and body thoroughly to remove the  shampoo.  4.  Use CHG as you would any other liquid soap.  You can apply chg directly  to the skin and wash                       Gently with a scrungie or clean washcloth.  5.  Apply the CHG Soap to your body ONLY FROM THE NECK DOWN.   Do not use on face/ open                           Wound or open sores. Avoid contact with eyes, ears mouth and genitals (private parts).                       Wash face,  Genitals (private parts) with your normal soap.             6.  Wash thoroughly, paying special attention to the area where your surgery  will be performed.  7.  Thoroughly rinse your body with warm water from the neck down.  8.  DO NOT shower/wash with your normal soap after using and rinsing off  the CHG Soap.                9.  Pat yourself dry with a clean towel.            10.  Wear clean pajamas.            11.  Place clean sheets on your bed the night of your first shower and do not  sleep with pets. Day of Surgery : Do not apply any lotions/deodorants the morning of surgery.  Please wear clean clothes to the hospital/surgery center.  FAILURE TO FOLLOW THESE INSTRUCTIONS MAY RESULT IN THE CANCELLATION OF YOUR SURGERY PATIENT  SIGNATURE_________________________________  NURSE SIGNATURE__________________________________  ________________________________________________________________________   Adam Phenix  An incentive spirometer is a tool that can help keep your lungs clear and active. This tool measures how well you are filling your lungs with each breath. Taking long deep breaths may help reverse or decrease the chance of developing breathing (pulmonary) problems (especially infection) following:  A long period of time when you are unable to move or be active. BEFORE THE PROCEDURE   If the spirometer includes an indicator to show your best effort, your nurse or respiratory therapist will set it to a desired goal.  If possible, sit up straight or lean slightly forward. Try not to slouch.  Hold the incentive spirometer in an upright position. INSTRUCTIONS FOR USE   Sit on the edge of your bed if possible, or sit up as far as you can in bed or on a chair.  Hold the incentive spirometer in an upright position.  Breathe out normally.  Place the mouthpiece in your mouth and seal your lips tightly around it.  Breathe in slowly and as deeply as possible, raising the piston or the ball toward the top of the column.  Hold your breath for 3-5 seconds or for as long as possible. Allow the piston or ball to fall to the bottom of the column.  Remove the mouthpiece from your mouth and breathe out normally.  Rest for a few seconds and repeat Steps 1 through 7 at least 10 times every 1-2 hours when you are awake. Take your time and take a few normal breaths between deep breaths.  The spirometer may include an indicator to show  your best effort. Use the indicator as a goal to work toward during each repetition.  After each set of 10 deep breaths, practice coughing to be sure your lungs are clear. If you have an incision (the cut made at the time of surgery), support your incision when coughing by placing a  pillow or rolled up towels firmly against it. Once you are able to get out of bed, walk around indoors and cough well. You may stop using the incentive spirometer when instructed by your caregiver.  RISKS AND COMPLICATIONS  Take your time so you do not get dizzy or light-headed.  If you are in pain, you may need to take or ask for pain medication before doing incentive spirometry. It is harder to take a deep breath if you are having pain. AFTER USE  Rest and breathe slowly and easily.  It can be helpful to keep track of a log of your progress. Your caregiver can provide you with a simple table to help with this. If you are using the spirometer at home, follow these instructions: [place] IF:   You are having difficultly using the spirometer.  You have trouble using the spirometer as often as instructed.  Your pain medication is not giving enough relief while using the spirometer.  You develop fever of 100.5 F (38.1 C) or higher. SEEK IMMEDIATE MEDICAL CARE IF:   You cough up bloody sputum that had not been present before.  You develop fever of 102 F (38.9 C) or greater.  You develop worsening pain at or near the incision site. MAKE SURE YOU:   Understand these instructions.  Will watch your condition.  Will get help right away if you are not doing well or get worse. Document Released: 03/06/2007 Document Revised: 01/16/2012 Document Reviewed: 05/07/2007 ExitCare Patient Information 2014 ExitCare, [place].   ________________________________________________________________________  WHAT IS A BLOOD TRANSFUSION? Blood Transfusion Information  A transfusion is the replacement of blood or some of its parts. Blood is made up of multiple cells which provide different functions.  Red blood cells carry oxygen and are used for blood loss replacement.  White blood cells fight against infection.  Platelets control bleeding.  Plasma helps clot blood.  Other blood  products are available for specialized needs, such as hemophilia or other clotting disorders. BEFORE THE TRANSFUSION  Who gives blood for transfusions?   Healthy volunteers who are fully evaluated to make sure their blood is safe. This is blood bank blood. Transfusion therapy is the safest it has ever been in the practice of medicine. Before blood is taken from a donor, a complete history is taken to make sure that person has no history of diseases nor engages in risky social behavior (examples are intravenous drug use or sexual activity with multiple partners). The donor's travel history is screened to minimize risk of transmitting infections, such as malaria. The donated blood is tested for signs of infectious diseases, such as HIV and hepatitis. The blood is then tested to be sure it is compatible with you in order to minimize the chance of a transfusion reaction. If you or a relative donates blood, this is often done in anticipation of surgery and is not appropriate for emergency situations. It takes many days to process the donated blood. RISKS AND COMPLICATIONS Although transfusion therapy is very safe and saves many lives, the main dangers of transfusion include:   Getting an infectious disease.  Developing a transfusion reaction. This is an allergic reaction to  something in the blood you were given. Every precaution is taken to prevent this. The decision to have a blood transfusion has been considered carefully by your caregiver before blood is given. Blood is not given unless the benefits outweigh the risks. AFTER THE TRANSFUSION  Right after receiving a blood transfusion, you will usually feel much better and more energetic. This is especially true if your red blood cells have gotten low (anemic). The transfusion raises the level of the red blood cells which carry oxygen, and this usually causes an energy increase.  The nurse administering the transfusion will monitor you carefully for  complications. HOME CARE INSTRUCTIONS  No special instructions are needed after a transfusion. You may find your energy is better. Speak with your caregiver about any limitations on activity for underlying diseases you may have. SEEK MEDICAL CARE IF:   Your condition is not improving after your transfusion.  You develop redness or irritation at the intravenous (IV) site. SEEK IMMEDIATE MEDICAL CARE IF:  Any of the following symptoms occur over the next 12 hours:  Shaking chills.  You have a temperature by mouth above 102 F (38.9 C), not controlled by medicine.  Chest, back, or muscle pain.  People around you feel you are not acting correctly or are confused.  Shortness of breath or difficulty breathing.  Dizziness and fainting.  You get a rash or develop hives.  You have a decrease in urine output.  Your urine turns a dark color or changes to pink, red, or brown. Any of the following symptoms occur over the next 10 days:  You have a temperature by mouth above 102 F (38.9 C), not controlled by medicine.  Shortness of breath.  Weakness after normal activity.  The white part of the eye turns yellow (jaundice).  You have a decrease in the amount of urine or are urinating less often.  Your urine turns a dark color or changes to pink, red, or brown. Document Released: 10/21/2000 Document Revised: 01/16/2012 Document Reviewed: 06/09/2008 ExitCare Patient Information 2014 ExitCare, [place].  _______________________________________________________________________   CLEAR LIQUID DIET   Foods Allowed                                                                     Foods Excluded  Coffee and tea, regular and decaf                             liquids that you cannot  Plain Jell-O in any flavor                                             see through such as: Fruit ices (not with fruit pulp)                                     milk, soups, orange juice  Iced Popsicles  All solid food Carbonated beverages, regular and diet                                    Cranberry, grape and apple juices Sports drinks like Gatorade Lightly seasoned clear broth or consume(fat free) Sugar, honey syrup  Sample Menu Breakfast                                Lunch                                     Supper Cranberry juice                    Beef broth                            Chicken broth Jell-O                                     Grape juice                           Apple juice Coffee or tea                        Jell-O                                      Popsicle                                                Coffee or tea                        Coffee or tea  _____________________________________________________________________

## 2015-11-27 ENCOUNTER — Encounter (HOSPITAL_COMMUNITY)
Admission: RE | Admit: 2015-11-27 | Discharge: 2015-11-27 | Disposition: A | Discharge status: Home / Self Care | Payer: Medicare Other | Source: Ambulatory Visit | Attending: Gynecologic Oncology | Admitting: Gynecologic Oncology

## 2015-11-27 ENCOUNTER — Encounter (HOSPITAL_COMMUNITY): Payer: Self-pay

## 2015-11-27 DIAGNOSIS — Z0183 Encounter for blood typing: Secondary | ICD-10-CM | POA: Diagnosis not present

## 2015-11-27 DIAGNOSIS — R1900 Intra-abdominal and pelvic swelling, mass and lump, unspecified site: Secondary | ICD-10-CM | POA: Diagnosis not present

## 2015-11-27 DIAGNOSIS — Z01812 Encounter for preprocedural laboratory examination: Secondary | ICD-10-CM | POA: Diagnosis present

## 2015-11-27 LAB — URINE MICROSCOPIC-ADD ON

## 2015-11-27 LAB — URINALYSIS, ROUTINE W REFLEX MICROSCOPIC
GLUCOSE, UA: NEGATIVE mg/dL
Hgb urine dipstick: NEGATIVE
Ketones, ur: NEGATIVE mg/dL
Nitrite: NEGATIVE
PH: 5 (ref 5.0–8.0)
PROTEIN: 30 mg/dL — AB
SPECIFIC GRAVITY, URINE: 1.031 — AB (ref 1.005–1.030)

## 2015-11-27 LAB — COMPREHENSIVE METABOLIC PANEL
ALT: 32 U/L (ref 14–54)
ANION GAP: 10 (ref 5–15)
AST: 33 U/L (ref 15–41)
Albumin: 3.9 g/dL (ref 3.5–5.0)
Alkaline Phosphatase: 82 U/L (ref 38–126)
BILIRUBIN TOTAL: 0.7 mg/dL (ref 0.3–1.2)
BUN: 25 mg/dL — ABNORMAL HIGH (ref 6–20)
CHLORIDE: 104 mmol/L (ref 101–111)
CO2: 25 mmol/L (ref 22–32)
Calcium: 9.7 mg/dL (ref 8.9–10.3)
Creatinine, Ser: 1.36 mg/dL — ABNORMAL HIGH (ref 0.44–1.00)
GFR calc Af Amer: 50 mL/min — ABNORMAL LOW (ref >60)
GFR, EST NON AFRICAN AMERICAN: 44 mL/min — AB (ref >60)
Glucose, Bld: 140 mg/dL — ABNORMAL HIGH (ref 65–99)
POTASSIUM: 4.6 mmol/L (ref 3.5–5.1)
Sodium: 139 mmol/L (ref 135–145)
TOTAL PROTEIN: 7.3 g/dL (ref 6.5–8.1)

## 2015-11-27 LAB — CBC WITH DIFFERENTIAL/PLATELET
BASOS PCT: 0 %
Basophils Absolute: 0 10*3/uL (ref 0.0–0.1)
EOS PCT: 3 %
Eosinophils Absolute: 0.3 10*3/uL (ref 0.0–0.7)
HEMATOCRIT: 39 % (ref 36.0–46.0)
Hemoglobin: 12.5 g/dL (ref 12.0–15.0)
Lymphocytes Relative: 22 %
Lymphs Abs: 2.2 10*3/uL (ref 0.7–4.0)
MCH: 29.6 pg (ref 26.0–34.0)
MCHC: 32.1 g/dL (ref 30.0–36.0)
MCV: 92.4 fL (ref 78.0–100.0)
MONO ABS: 0.6 10*3/uL (ref 0.1–1.0)
MONOS PCT: 6 %
NEUTROS ABS: 6.6 10*3/uL (ref 1.7–7.7)
Neutrophils Relative %: 69 %
PLATELETS: 279 10*3/uL (ref 150–400)
RBC: 4.22 MIL/uL (ref 3.87–5.11)
RDW: 14.3 % (ref 11.5–15.5)
WBC: 9.7 10*3/uL (ref 4.0–10.5)

## 2015-11-27 LAB — PREGNANCY, URINE: Preg Test, Ur: NEGATIVE

## 2015-11-27 LAB — SURGICAL PCR SCREEN
MRSA, PCR: POSITIVE — AB
Staphylococcus aureus: POSITIVE — AB

## 2015-11-27 LAB — ABO/RH: ABO/RH(D): O POS

## 2015-11-27 NOTE — Progress Notes (Signed)
11/24/2015-Pre-operative clearance from Hubbard Robinson, PA from [facility] on chart. 10/08/2015-noted EKG in EPIC. 10/07/2015-noted CT abdomen/pelvis w/contrast in EPIC.

## 2015-12-01 NOTE — Anesthesia Preprocedure Evaluation (Addendum)
Anesthesia Evaluation  Patient identified by MRN, date of birth, ID band Patient awake    Reviewed: Allergy & Precautions, NPO status , Patient's Chart, lab work & pertinent test results  Airway Mallampati: III   Neck ROM: Limited    Dental  (+) Edentulous Upper, Dental Advisory Given   Pulmonary neg pulmonary ROS, asthma , former smoker,    breath sounds clear to auscultation       Cardiovascular hypertension, Pt. on medications  Rhythm:Regular  EKG 10/2015 Normal   Neuro/Psych Anxiety Depression negative neurological ROS  negative psych ROS   GI/Hepatic Neg liver ROS, GERD  Medicated,(+)     substance abuse  cocaine use,   Endo/Other  negative endocrine ROSdiabetes, Type 2Morbid obesity  Renal/GU Creat 1.36  negative genitourinary   Musculoskeletal negative musculoskeletal ROS (+)   Abdominal (+) + obese,   Peds negative pediatric ROS (+)  Hematology negative hematology ROS (+) 12/39   Anesthesia Other Findings   Reproductive/Obstetrics negative OB ROS                            Anesthesia Physical Anesthesia Plan  ASA: III  Anesthesia Plan: General   Post-op Pain Management:    Induction: Intravenous  Airway Management Planned: Oral ETT and Video Laryngoscope Planned  Additional Equipment:   Intra-op Plan:   Post-operative Plan: Extubation in OR  Informed Consent: I have reviewed the patients History and Physical, chart, labs and discussed the procedure including the risks, benefits and alternatives for the proposed anesthesia with the patient or authorized representative who has indicated his/her understanding and acceptance.     Plan Discussed with:   Anesthesia Plan Comments: (Glide available, Chronic Pain Patient being followed by Pain clinic, Recover head elevated, Precedex would be helpful)        Anesthesia Quick Evaluation

## 2015-12-02 MED ORDER — DEXTROSE 5 % IV SOLN
3.0000 g | INTRAVENOUS | Status: AC
  Administered 2015-12-03: 3 g via INTRAVENOUS
  Filled 2015-12-02: qty 3000

## 2015-12-03 ENCOUNTER — Inpatient Hospital Stay (HOSPITAL_COMMUNITY): Payer: Medicare Other | Admitting: Anesthesiology

## 2015-12-03 ENCOUNTER — Inpatient Hospital Stay (HOSPITAL_COMMUNITY)
Admission: RE | Admit: 2015-12-03 | Discharge: 2015-12-05 | DRG: 742 | Disposition: A | Discharge status: Home / Self Care | Payer: Medicare Other | Source: Ambulatory Visit | Attending: Obstetrics & Gynecology | Admitting: Obstetrics & Gynecology

## 2015-12-03 ENCOUNTER — Encounter (HOSPITAL_COMMUNITY)
Admission: RE | Disposition: A | Discharge status: Home / Self Care | Payer: Self-pay | Source: Ambulatory Visit | Attending: Gynecologic Oncology

## 2015-12-03 ENCOUNTER — Encounter (HOSPITAL_COMMUNITY): Payer: Self-pay | Admitting: *Deleted

## 2015-12-03 ENCOUNTER — Ambulatory Visit (HOSPITAL_COMMUNITY): Payer: Medicare Other

## 2015-12-03 DIAGNOSIS — R000 Tachycardia, unspecified: Secondary | ICD-10-CM

## 2015-12-03 DIAGNOSIS — E119 Type 2 diabetes mellitus without complications: Secondary | ICD-10-CM | POA: Diagnosis present

## 2015-12-03 DIAGNOSIS — Z6841 Body Mass Index (BMI) 40.0 and over, adult: Secondary | ICD-10-CM

## 2015-12-03 DIAGNOSIS — Z79899 Other long term (current) drug therapy: Secondary | ICD-10-CM

## 2015-12-03 DIAGNOSIS — N83292 Other ovarian cyst, left side: Secondary | ICD-10-CM | POA: Diagnosis present

## 2015-12-03 DIAGNOSIS — Z22322 Carrier or suspected carrier of Methicillin resistant Staphylococcus aureus: Secondary | ICD-10-CM

## 2015-12-03 DIAGNOSIS — I10 Essential (primary) hypertension: Secondary | ICD-10-CM | POA: Diagnosis present

## 2015-12-03 DIAGNOSIS — G8929 Other chronic pain: Secondary | ICD-10-CM

## 2015-12-03 DIAGNOSIS — Z8614 Personal history of Methicillin resistant Staphylococcus aureus infection: Secondary | ICD-10-CM

## 2015-12-03 DIAGNOSIS — R1900 Intra-abdominal and pelvic swelling, mass and lump, unspecified site: Secondary | ICD-10-CM

## 2015-12-03 DIAGNOSIS — E08628 Diabetes mellitus due to underlying condition with other skin complications: Secondary | ICD-10-CM | POA: Diagnosis present

## 2015-12-03 DIAGNOSIS — Z01812 Encounter for preprocedural laboratory examination: Secondary | ICD-10-CM

## 2015-12-03 DIAGNOSIS — F419 Anxiety disorder, unspecified: Secondary | ICD-10-CM | POA: Diagnosis present

## 2015-12-03 DIAGNOSIS — N838 Other noninflammatory disorders of ovary, fallopian tube and broad ligament: Secondary | ICD-10-CM

## 2015-12-03 DIAGNOSIS — N83512 Torsion of left ovary and ovarian pedicle: Principal | ICD-10-CM

## 2015-12-03 DIAGNOSIS — J449 Chronic obstructive pulmonary disease, unspecified: Secondary | ICD-10-CM | POA: Diagnosis present

## 2015-12-03 DIAGNOSIS — Z882 Allergy status to sulfonamides status: Secondary | ICD-10-CM

## 2015-12-03 DIAGNOSIS — N839 Noninflammatory disorder of ovary, fallopian tube and broad ligament, unspecified: Secondary | ICD-10-CM | POA: Diagnosis present

## 2015-12-03 DIAGNOSIS — J45909 Unspecified asthma, uncomplicated: Secondary | ICD-10-CM | POA: Diagnosis present

## 2015-12-03 DIAGNOSIS — K219 Gastro-esophageal reflux disease without esophagitis: Secondary | ICD-10-CM | POA: Diagnosis present

## 2015-12-03 HISTORY — PX: LAPAROTOMY: SHX154

## 2015-12-03 HISTORY — PX: SALPINGOOPHORECTOMY: SHX82

## 2015-12-03 LAB — URINALYSIS, ROUTINE W REFLEX MICROSCOPIC
BILIRUBIN URINE: NEGATIVE
Glucose, UA: >1000 mg/dL — AB
Hgb urine dipstick: NEGATIVE
Ketones, ur: NEGATIVE mg/dL
LEUKOCYTES UA: NEGATIVE
NITRITE: NEGATIVE
Protein, ur: NEGATIVE mg/dL
SPECIFIC GRAVITY, URINE: 1.022 (ref 1.005–1.030)
pH: 6 (ref 5.0–8.0)

## 2015-12-03 LAB — HEMOGLOBIN AND HEMATOCRIT, BLOOD
HCT: 36.1 % (ref 36.0–46.0)
Hemoglobin: 12 g/dL (ref 12.0–15.0)

## 2015-12-03 LAB — GLUCOSE, CAPILLARY
Glucose-Capillary: 122 mg/dL — ABNORMAL HIGH (ref 65–99)
Glucose-Capillary: 129 mg/dL — ABNORMAL HIGH (ref 65–99)
Glucose-Capillary: 157 mg/dL — ABNORMAL HIGH (ref 65–99)
Glucose-Capillary: 321 mg/dL — ABNORMAL HIGH (ref 65–99)
Glucose-Capillary: 237 mg/dL — ABNORMAL HIGH (ref 65–99)

## 2015-12-03 LAB — URINE MICROSCOPIC-ADD ON: RBC / HPF: NONE SEEN RBC/hpf (ref 0–5)

## 2015-12-03 LAB — TYPE AND SCREEN
ABO/RH(D): O POS
ANTIBODY SCREEN: NEGATIVE

## 2015-12-03 LAB — TROPONIN I

## 2015-12-03 SURGERY — LAPAROTOMY, EXPLORATORY
Anesthesia: General | Site: Abdomen

## 2015-12-03 MED ORDER — HYDROMORPHONE HCL 1 MG/ML IJ SOLN
0.2500 mg | INTRAMUSCULAR | Status: DC | PRN
  Administered 2015-12-03 (×4): 0.5 mg via INTRAVENOUS

## 2015-12-03 MED ORDER — SODIUM CHLORIDE 0.9 % IV BOLUS (SEPSIS)
500.0000 mL | Freq: Once | INTRAVENOUS | Status: AC
  Administered 2015-12-03: 500 mL via INTRAVENOUS

## 2015-12-03 MED ORDER — HYDROMORPHONE HCL 1 MG/ML IJ SOLN
INTRAMUSCULAR | Status: AC
  Filled 2015-12-03: qty 1

## 2015-12-03 MED ORDER — HYDROMORPHONE HCL 1 MG/ML IJ SOLN
0.2000 mg | INTRAMUSCULAR | Status: AC | PRN
  Administered 2015-12-03 (×2): 0.5 mg via INTRAVENOUS
  Filled 2015-12-03 (×2): qty 1

## 2015-12-03 MED ORDER — DULOXETINE HCL 60 MG PO CPEP
120.0000 mg | ORAL_CAPSULE | Freq: Every day | ORAL | Status: DC
  Administered 2015-12-03 – 2015-12-05 (×3): 120 mg via ORAL
  Filled 2015-12-03 (×3): qty 2

## 2015-12-03 MED ORDER — LIDOCAINE HCL (CARDIAC) 20 MG/ML IV SOLN
INTRAVENOUS | Status: AC
  Filled 2015-12-03: qty 5

## 2015-12-03 MED ORDER — ENOXAPARIN SODIUM 40 MG/0.4ML ~~LOC~~ SOLN
40.0000 mg | SUBCUTANEOUS | Status: AC
  Administered 2015-12-03: 40 mg via SUBCUTANEOUS
  Filled 2015-12-03: qty 0.4

## 2015-12-03 MED ORDER — MEPERIDINE HCL 50 MG/ML IJ SOLN: 6.2500 mg | INTRAMUSCULAR | Status: DC | PRN

## 2015-12-03 MED ORDER — MIDAZOLAM HCL 5 MG/5ML IJ SOLN
INTRAMUSCULAR | Status: DC | PRN
  Administered 2015-12-03: 2 mg via INTRAVENOUS

## 2015-12-03 MED ORDER — FENTANYL CITRATE (PF) 100 MCG/2ML IJ SOLN
INTRAMUSCULAR | Status: DC | PRN
  Administered 2015-12-03: 50 ug via INTRAVENOUS

## 2015-12-03 MED ORDER — ROCURONIUM BROMIDE 100 MG/10ML IV SOLN
INTRAVENOUS | Status: DC | PRN
  Administered 2015-12-03: 50 mg via INTRAVENOUS

## 2015-12-03 MED ORDER — MAGNESIUM HYDROXIDE 400 MG/5ML PO SUSP
30.0000 mL | Freq: Three times a day (TID) | ORAL | Status: AC
  Administered 2015-12-03 – 2015-12-04 (×3): 30 mL via ORAL
  Filled 2015-12-03 (×3): qty 30

## 2015-12-03 MED ORDER — INSULIN GLARGINE 100 UNIT/ML ~~LOC~~ SOLN
20.0000 [IU] | Freq: Every day | SUBCUTANEOUS | Status: DC
  Administered 2015-12-03 – 2015-12-04 (×2): 20 [IU] via SUBCUTANEOUS
  Filled 2015-12-03 (×2): qty 0.2

## 2015-12-03 MED ORDER — ENOXAPARIN SODIUM 40 MG/0.4ML ~~LOC~~ SOLN
40.0000 mg | SUBCUTANEOUS | Status: DC
  Administered 2015-12-04 – 2015-12-05 (×2): 40 mg via SUBCUTANEOUS
  Filled 2015-12-03 (×3): qty 0.4

## 2015-12-03 MED ORDER — LIDOCAINE HCL (CARDIAC) 20 MG/ML IV SOLN
INTRAVENOUS | Status: DC | PRN
  Administered 2015-12-03: 100 mg via INTRAVENOUS

## 2015-12-03 MED ORDER — ALBUTEROL SULFATE (2.5 MG/3ML) 0.083% IN NEBU
2.5000 mg | INHALATION_SOLUTION | Freq: Four times a day (QID) | RESPIRATORY_TRACT | Status: DC | PRN
  Administered 2015-12-03: 2.5 mg via RESPIRATORY_TRACT
  Filled 2015-12-03: qty 3

## 2015-12-03 MED ORDER — SODIUM CHLORIDE 0.9 % IJ SOLN
INTRAMUSCULAR | Status: AC
  Filled 2015-12-03: qty 200

## 2015-12-03 MED ORDER — GABAPENTIN 300 MG PO CAPS
600.0000 mg | ORAL_CAPSULE | Freq: Three times a day (TID) | ORAL | Status: DC
  Administered 2015-12-03 – 2015-12-05 (×6): 600 mg via ORAL
  Filled 2015-12-03 (×8): qty 2

## 2015-12-03 MED ORDER — MIDAZOLAM HCL 2 MG/2ML IJ SOLN
INTRAMUSCULAR | Status: AC
  Filled 2015-12-03: qty 2

## 2015-12-03 MED ORDER — LISINOPRIL-HYDROCHLOROTHIAZIDE 20-25 MG PO TABS: 1.0000 | ORAL_TABLET | Freq: Every day | ORAL | Status: DC

## 2015-12-03 MED ORDER — CETYLPYRIDINIUM CHLORIDE 0.05 % MT LIQD
7.0000 mL | Freq: Two times a day (BID) | OROMUCOSAL | Status: DC
  Administered 2015-12-03 – 2015-12-05 (×3): 7 mL via OROMUCOSAL

## 2015-12-03 MED ORDER — PROPOFOL 10 MG/ML IV BOLUS
INTRAVENOUS | Status: DC | PRN
  Administered 2015-12-03: 250 mg via INTRAVENOUS

## 2015-12-03 MED ORDER — MIRTAZAPINE 7.5 MG PO TABS
7.5000 mg | ORAL_TABLET | Freq: Every evening | ORAL | Status: DC | PRN
  Filled 2015-12-03: qty 2

## 2015-12-03 MED ORDER — INSULIN ASPART 100 UNIT/ML ~~LOC~~ SOLN
3.0000 [IU] | Freq: Three times a day (TID) | SUBCUTANEOUS | Status: DC
  Administered 2015-12-04 (×3): 3 [IU] via SUBCUTANEOUS

## 2015-12-03 MED ORDER — INSULIN ASPART 100 UNIT/ML ~~LOC~~ SOLN
0.0000 [IU] | Freq: Three times a day (TID) | SUBCUTANEOUS | Status: DC
  Administered 2015-12-04 (×2): 4 [IU] via SUBCUTANEOUS
  Administered 2015-12-04: 3 [IU] via SUBCUTANEOUS

## 2015-12-03 MED ORDER — PROPOFOL 10 MG/ML IV BOLUS
INTRAVENOUS | Status: AC
  Filled 2015-12-03: qty 40

## 2015-12-03 MED ORDER — LUBIPROSTONE 24 MCG PO CAPS
24.0000 ug | ORAL_CAPSULE | Freq: Two times a day (BID) | ORAL | Status: DC
  Administered 2015-12-03 – 2015-12-05 (×4): 24 ug via ORAL
  Filled 2015-12-03 (×6): qty 1

## 2015-12-03 MED ORDER — SUGAMMADEX SODIUM 500 MG/5ML IV SOLN
INTRAVENOUS | Status: DC | PRN
  Administered 2015-12-03: 300 mg via INTRAVENOUS

## 2015-12-03 MED ORDER — METHOCARBAMOL 750 MG PO TABS
750.0000 mg | ORAL_TABLET | Freq: Three times a day (TID) | ORAL | Status: DC
  Administered 2015-12-03 – 2015-12-05 (×6): 750 mg via ORAL
  Filled 2015-12-03 (×8): qty 1

## 2015-12-03 MED ORDER — PHENYLEPHRINE HCL 10 MG/ML IJ SOLN
INTRAMUSCULAR | Status: AC
  Filled 2015-12-03: qty 1

## 2015-12-03 MED ORDER — SODIUM CHLORIDE 0.9 % IV SOLN
10.0000 mg | INTRAVENOUS | Status: DC | PRN
  Administered 2015-12-03: 50 ug/min via INTRAVENOUS

## 2015-12-03 MED ORDER — DEXAMETHASONE SODIUM PHOSPHATE 10 MG/ML IJ SOLN
INTRAMUSCULAR | Status: DC | PRN
  Administered 2015-12-03: 10 mg via INTRAVENOUS

## 2015-12-03 MED ORDER — SUGAMMADEX SODIUM 500 MG/5ML IV SOLN
INTRAVENOUS | Status: AC
  Filled 2015-12-03: qty 5

## 2015-12-03 MED ORDER — LIP MEDEX EX OINT
TOPICAL_OINTMENT | CUTANEOUS | Status: AC
  Filled 2015-12-03: qty 7

## 2015-12-03 MED ORDER — FENTANYL CITRATE (PF) 250 MCG/5ML IJ SOLN
INTRAMUSCULAR | Status: AC
  Filled 2015-12-03: qty 5

## 2015-12-03 MED ORDER — BUPIVACAINE LIPOSOME 1.3 % IJ SUSP
20.0000 mL | Freq: Once | INTRAMUSCULAR | Status: DC
  Filled 2015-12-03: qty 20

## 2015-12-03 MED ORDER — LACTATED RINGERS IV SOLN
INTRAVENOUS | Status: DC | PRN
  Administered 2015-12-03 (×2): via INTRAVENOUS

## 2015-12-03 MED ORDER — ROCURONIUM BROMIDE 100 MG/10ML IV SOLN
INTRAVENOUS | Status: AC
  Filled 2015-12-03: qty 1

## 2015-12-03 MED ORDER — AMLODIPINE BESYLATE 10 MG PO TABS
10.0000 mg | ORAL_TABLET | Freq: Every day | ORAL | Status: DC
  Administered 2015-12-03 – 2015-12-05 (×3): 10 mg via ORAL
  Filled 2015-12-03 (×3): qty 1

## 2015-12-03 MED ORDER — ONDANSETRON HCL 4 MG/2ML IJ SOLN
INTRAMUSCULAR | Status: AC
Start: 2015-12-03 — End: 2015-12-03
  Filled 2015-12-03: qty 2

## 2015-12-03 MED ORDER — GLUCERNA SHAKE PO LIQD
237.0000 mL | Freq: Three times a day (TID) | ORAL | Status: DC
  Administered 2015-12-03 – 2015-12-05 (×5): 237 mL via ORAL
  Filled 2015-12-03 (×7): qty 237

## 2015-12-03 MED ORDER — LEVOTHYROXINE SODIUM 50 MCG PO TABS
50.0000 ug | ORAL_TABLET | Freq: Every day | ORAL | Status: DC
  Administered 2015-12-04 – 2015-12-05 (×2): 50 ug via ORAL
  Filled 2015-12-03 (×3): qty 1

## 2015-12-03 MED ORDER — LISINOPRIL 20 MG PO TABS
20.0000 mg | ORAL_TABLET | Freq: Every day | ORAL | Status: DC
  Administered 2015-12-04 – 2015-12-05 (×2): 20 mg via ORAL
  Filled 2015-12-03 (×3): qty 1

## 2015-12-03 MED ORDER — OXYCODONE HCL 5 MG PO TABS
15.0000 mg | ORAL_TABLET | Freq: Four times a day (QID) | ORAL | Status: DC | PRN
  Administered 2015-12-03 – 2015-12-05 (×8): 15 mg via ORAL
  Filled 2015-12-03 (×9): qty 3

## 2015-12-03 MED ORDER — ALBUTEROL SULFATE HFA 108 (90 BASE) MCG/ACT IN AERS: 2.0000 | INHALATION_SPRAY | Freq: Four times a day (QID) | RESPIRATORY_TRACT | Status: DC | PRN

## 2015-12-03 MED ORDER — PANTOPRAZOLE SODIUM 40 MG PO TBEC
80.0000 mg | DELAYED_RELEASE_TABLET | Freq: Every day | ORAL | Status: DC
  Administered 2015-12-03 – 2015-12-05 (×3): 80 mg via ORAL
  Filled 2015-12-03 (×3): qty 2

## 2015-12-03 MED ORDER — DEXAMETHASONE SODIUM PHOSPHATE 10 MG/ML IJ SOLN
INTRAMUSCULAR | Status: AC
Start: 2015-12-03 — End: 2015-12-03
  Filled 2015-12-03: qty 1

## 2015-12-03 MED ORDER — PHENYLEPHRINE HCL 10 MG/ML IJ SOLN
INTRAMUSCULAR | Status: DC | PRN
  Administered 2015-12-03 (×2): 80 ug via INTRAVENOUS

## 2015-12-03 MED ORDER — BACLOFEN 10 MG PO TABS
10.0000 mg | ORAL_TABLET | Freq: Two times a day (BID) | ORAL | Status: DC | PRN
  Filled 2015-12-03: qty 1

## 2015-12-03 MED ORDER — ONDANSETRON HCL 4 MG/2ML IJ SOLN
INTRAMUSCULAR | Status: DC | PRN
  Administered 2015-12-03: 4 mg via INTRAVENOUS

## 2015-12-03 MED ORDER — INSULIN ASPART 100 UNIT/ML ~~LOC~~ SOLN
0.0000 [IU] | Freq: Three times a day (TID) | SUBCUTANEOUS | Status: DC
  Administered 2015-12-03: 4 [IU] via SUBCUTANEOUS
  Administered 2015-12-03: 15 [IU] via SUBCUTANEOUS

## 2015-12-03 MED ORDER — PROMETHAZINE HCL 25 MG/ML IJ SOLN
6.2500 mg | INTRAMUSCULAR | Status: DC | PRN
Start: 2015-12-03 — End: 2015-12-03

## 2015-12-03 MED ORDER — HYDROCHLOROTHIAZIDE 25 MG PO TABS
25.0000 mg | ORAL_TABLET | Freq: Every day | ORAL | Status: DC
  Administered 2015-12-04 – 2015-12-05 (×2): 25 mg via ORAL
  Filled 2015-12-03 (×3): qty 1

## 2015-12-03 MED ORDER — SIMVASTATIN 20 MG PO TABS
20.0000 mg | ORAL_TABLET | Freq: Every day | ORAL | Status: DC
  Administered 2015-12-03 – 2015-12-04 (×2): 20 mg via ORAL
  Filled 2015-12-03 (×3): qty 1

## 2015-12-03 MED ORDER — KCL IN DEXTROSE-NACL 20-5-0.45 MEQ/L-%-% IV SOLN
INTRAVENOUS | Status: DC
  Administered 2015-12-03: 12:00:00 via INTRAVENOUS
  Filled 2015-12-03 (×2): qty 1000

## 2015-12-03 MED ORDER — DEXTROSE 5 % IV SOLN: 3.0000 g | INTRAVENOUS | Status: DC

## 2015-12-03 MED ORDER — ONDANSETRON HCL 4 MG/2ML IJ SOLN: 4.0000 mg | Freq: Four times a day (QID) | INTRAMUSCULAR | Status: DC | PRN

## 2015-12-03 MED ORDER — SUCCINYLCHOLINE CHLORIDE 20 MG/ML IJ SOLN
INTRAMUSCULAR | Status: DC | PRN
  Administered 2015-12-03: 160 mg via INTRAVENOUS

## 2015-12-03 MED ORDER — SODIUM CHLORIDE 0.45 % IV SOLN
INTRAVENOUS | Status: DC
  Administered 2015-12-03: via INTRAVENOUS

## 2015-12-03 MED ORDER — ACETAMINOPHEN 500 MG PO TABS
1000.0000 mg | ORAL_TABLET | Freq: Four times a day (QID) | ORAL | Status: DC
  Administered 2015-12-03 – 2015-12-05 (×7): 1000 mg via ORAL
  Filled 2015-12-03 (×11): qty 2

## 2015-12-03 MED ORDER — INSULIN ASPART 100 UNIT/ML ~~LOC~~ SOLN
0.0000 [IU] | Freq: Every day | SUBCUTANEOUS | Status: DC
  Administered 2015-12-03: 4 [IU] via SUBCUTANEOUS

## 2015-12-03 MED ORDER — ALPRAZOLAM 1 MG PO TABS: 1.0000 mg | ORAL_TABLET | Freq: Every evening | ORAL | Status: DC | PRN

## 2015-12-03 MED ORDER — ONDANSETRON HCL 4 MG PO TABS: 4.0000 mg | ORAL_TABLET | Freq: Four times a day (QID) | ORAL | Status: DC | PRN

## 2015-12-03 MED ORDER — MELOXICAM 15 MG PO TABS
15.0000 mg | ORAL_TABLET | Freq: Every day | ORAL | Status: DC
  Administered 2015-12-03 – 2015-12-05 (×3): 15 mg via ORAL
  Filled 2015-12-03 (×3): qty 1

## 2015-12-03 SURGICAL SUPPLY — 46 items
ATTRACTOMAT 16X20 MAGNETIC DRP (DRAPES) ×3 IMPLANT
BLADE EXTENDED COATED 6.5IN (ELECTRODE) ×3 IMPLANT
CELLS DAT CNTRL 66122 CELL SVR (MISCELLANEOUS) ×2 IMPLANT
CHLORAPREP W/TINT 26ML (MISCELLANEOUS) ×3 IMPLANT
CLIP TI LARGE 6 (CLIP) ×3 IMPLANT
CLIP TI MEDIUM 6 (CLIP) ×3 IMPLANT
CLIP TI MEDIUM LARGE 6 (CLIP) IMPLANT
CONT SPEC 4OZ CLIKSEAL STRL BL (MISCELLANEOUS) IMPLANT
COVER SURGICAL LIGHT HANDLE (MISCELLANEOUS) ×3 IMPLANT
DRAPE UTILITY 15X26 (DRAPE) ×3 IMPLANT
DRAPE WARM FLUID 44X44 (DRAPE) ×3 IMPLANT
DRESSING TELFA ISLAND 4X8 (GAUZE/BANDAGES/DRESSINGS) ×3 IMPLANT
DRSG OPSITE POSTOP 4X8 (GAUZE/BANDAGES/DRESSINGS) ×3 IMPLANT
ELECT LIGASURE SHORT 9 REUSE (ELECTRODE) IMPLANT
ELECT REM PT RETURN 9FT ADLT (ELECTROSURGICAL) ×3
ELECTRODE REM PT RTRN 9FT ADLT (ELECTROSURGICAL) ×2 IMPLANT
GAUZE SPONGE 4X4 12PLY STRL (GAUZE/BANDAGES/DRESSINGS) IMPLANT
GAUZE SPONGE 4X4 16PLY XRAY LF (GAUZE/BANDAGES/DRESSINGS) ×3 IMPLANT
GLOVE BIO SURGEON STRL SZ 6 (GLOVE) ×6 IMPLANT
GLOVE BIO SURGEON STRL SZ 6.5 (GLOVE) ×6 IMPLANT
GOWN STRL REUS W/ TWL LRG LVL3 (GOWN DISPOSABLE) ×4 IMPLANT
GOWN STRL REUS W/TWL LRG LVL3 (GOWN DISPOSABLE) ×2
KIT BASIN OR (CUSTOM PROCEDURE TRAY) ×3 IMPLANT
LIQUID BAND (GAUZE/BANDAGES/DRESSINGS) ×3 IMPLANT
LOOP VESSEL MAXI BLUE (MISCELLANEOUS) IMPLANT
NEEDLE BLUNT 17GA (NEEDLE) ×9 IMPLANT
NS IRRIG 1000ML POUR BTL (IV SOLUTION) ×6 IMPLANT
PACK GENERAL/GYN (CUSTOM PROCEDURE TRAY) ×3 IMPLANT
RTRCTR WOUND ALEXIS 18CM MED (MISCELLANEOUS) ×3
SHEET LAVH (DRAPES) ×3 IMPLANT
SPONGE LAP 18X18 X RAY DECT (DISPOSABLE) IMPLANT
STAPLER VISISTAT 35W (STAPLE) IMPLANT
SUT MNCRL AB 4-0 PS2 18 (SUTURE) ×3 IMPLANT
SUT PDS AB 1 TP1 96 (SUTURE) ×6 IMPLANT
SUT VIC AB 0 CT1 36 (SUTURE) ×3 IMPLANT
SUT VIC AB 2-0 CT1 36 (SUTURE) ×6 IMPLANT
SUT VIC AB 2-0 CT2 27 (SUTURE) ×6 IMPLANT
SUT VIC AB 2-0 SH 27 (SUTURE) ×1
SUT VIC AB 2-0 SH 27X BRD (SUTURE) ×2 IMPLANT
SUT VIC AB 3-0 CTX 36 (SUTURE) IMPLANT
SUT VIC AB 3-0 SH 27 (SUTURE)
SUT VIC AB 3-0 SH 27X BRD (SUTURE) IMPLANT
SYR 50ML LL SCALE MARK (SYRINGE) ×9 IMPLANT
TOWEL OR 17X26 10 PK STRL BLUE (TOWEL DISPOSABLE) ×3 IMPLANT
TOWEL OR NON WOVEN STRL DISP B (DISPOSABLE) ×3 IMPLANT
TRAY FOLEY W/METER SILVER 14FR (SET/KITS/TRAYS/PACK) ×3 IMPLANT

## 2015-12-03 NOTE — Progress Notes (Signed)
Dr. Denman George notified via phone nurse in to get pt up and check vitals. BP found low at 107/63 and HR regular 120-123. Pt asymptomatic eating dinner. O2 sats 96% on 2L. Afebrile. See new orders received.

## 2015-12-03 NOTE — Transfer of Care (Signed)
Immediate Anesthesia Transfer of Care Note  Patient: Martha Schwartz  Procedure(s) Performed: Procedure(s): EXPLORATORY LAPAROTOMY  (N/A) LEFT SALPINGO OOPHORECTOMY (Left)  Patient Location: PACU  Anesthesia Type:General  Level of Consciousness: sedated  Airway & Oxygen Therapy: Patient Spontanous Breathing and Patient connected to face mask oxygen  Post-op Assessment: Report given to RN and Post -op Vital signs reviewed and stable  Post vital signs: Reviewed and stable  Last Vitals:  Filed Vitals:   12/03/15 0540  BP: 97/48  Pulse: 98  Temp: 36.8 C  Resp: 16    Complications: No apparent anesthesia complications

## 2015-12-03 NOTE — Progress Notes (Signed)
Orthostatic VS obtained as ordered. Pt up to chair with legs elevated. Tolerated standing and pivoting to chair fair. Pt stated "My knees are bad."

## 2015-12-03 NOTE — Op Note (Signed)
OPERATIVE NOTE  12/03/15  Preoperative Diagnosis:  Left ovarian cyst, extreme morbid obesity (BMI 50kg/m2).  Postoperative Diagnosis:left ovarian torsion    Procedure(s) Performed: Exploratory laparotomy with left salpingo-oophorectomy  Surgeon: Thereasa Solo, MD.  Assistant Surgeon: Dr Lahoma Crocker, M.D. Assistant: (an MD assistant was necessary for tissue manipulation, retraction and positioning due to the complexity of the case and hospital policies).   Specimens: washings, left tube and ovary    Estimated Blood Loss: 50 mL.    Urine [id number] cc  Complications: None.   Operative Findings: 23cm left ovarian cyst containing clear amber fluid. Torsion at the IP pedicle. Benign cyst on frozen section. Normal 6cm uterus. Norma 3cm right ovary. Benign left ovary on frozen section. Extreme intraperitoneal adiposity from severe morbid obesity (BMI 50) which made visualization of peritoneal structures challenging and required an additional 30 minutes of operating time to facilitate visualization of vascular pedicles. Additional staffing required in OR (by 100%) for positioning and patient transfer due to obesity.  Due to extreme obesity, and normal anatomy of right ovary, a unilateral oophorectomy was performed as it was felt to be unsafe to pursue a right salpingo-oophorectomy and instrumentation was not long enough to reach the vascular pedicles on the right.    Procedure:   The patient was seen in the Holding Room. The risks, benefits, complications, treatment options, and expected outcomes were discussed with the patient.  The patient concurred with the proposed plan, giving informed consent.   The patient was  identified as Martha Schwartz  and the procedure verified as BSO. A Time Out was held and the above information confirmed upon entry to the operating room..  After induction of anesthesia, the patient was draped and prepped in the usual sterile manner.  She was prepped and draped  in the normal sterile fashion in the dorsal lithotomy position in padded Allen stirrups with good attention paid to support of the lower back and lower extremities. Position was adjusted for appropriate support. A Foley catheter was placed to gravity.   A midline vertical incision was made and carried through the subcutaneous tissue to the fascia. The fascial incision was made and extended superiorally. The rectus muscles were separated. The peritoneum was identified and entered. Peritoneal incision was extended longitudinally.  The abdominal cavity was entered sharply and without incident. An allexis self retaining retractor was then placed. A survey of the abdomen and pelvis revealed the above findings, which were significant for normal right tube and ovary, 23cm (benign on frozen) left ovarian cystic mass with torsion at its vascular pedicle.   Washings were obtained. The left ovarian cyst was decompressed with a gallbladder trochar to facilitate deliver through the incision. After packing the small bowel into the upper abdomen with laparotomy sponges, we cross clamped the left infundibulo-pelvic ligametn and utero-ovarian ligament in a single pedicle (it was cinched together from the torsion). We double clamped this pedicle, and prior to clamping and cutting, verified with palpation that the left ureter was deep to the clamp.  The pedicle was transected and suture ligated. Hemostasis was observed at the vascular pedicle.   The right tube and ovary were inspected and noted to be grossly normal.  The peritoneal cavity was irrigated and hemostasis was confirmed at all surgical sites.   The fascia was reapproximated with 0 looped PDS using a total of two sutures. The subcutaneous layer was then irrigated copiously.  Exparel long acting local anesthetic was infiltrated into the subcutaneous tissues. The subcutaneous  fat was reapproximated. The skin was closed with subcutaneous suture. The patient tolerated  the procedure well.   Sponge, lap and needle counts were correct x 2.   Donaciano Eva, MD

## 2015-12-03 NOTE — Anesthesia Postprocedure Evaluation (Signed)
Anesthesia Post Note  Patient: Martha Schwartz  Procedure(s) Performed: Procedure(s) (LRB): EXPLORATORY LAPAROTOMY  (N/A) LEFT SALPINGO OOPHORECTOMY (Left)  Patient location during evaluation: PACU Anesthesia Type: General Level of consciousness: awake and alert Pain management: pain level controlled Vital Signs Assessment: post-procedure vital signs reviewed and stable Respiratory status: spontaneous breathing, nonlabored ventilation, respiratory function stable and patient connected to nasal cannula oxygen Cardiovascular status: blood pressure returned to baseline and stable Postop Assessment: no signs of nausea or vomiting Anesthetic complications: no    Last Vitals:  Filed Vitals:   12/03/15 0945 12/03/15 1000  BP: 116/69 130/82  Pulse: 106 102  Temp:    Resp: 16 14    Last Pain:  Filed Vitals:   12/03/15 1001  PainSc: Asleep                 Arnecia Ector

## 2015-12-03 NOTE — Addendum Note (Signed)
Addendum  created 12/03/15 1123 by Lind Covert, CRNA   Modules edited: Anesthesia Flowsheet

## 2015-12-03 NOTE — Progress Notes (Signed)
Dr. Denman George called unit to share hemoglobin results with nurse and to inform nursing of new orders in EPIC. Recent orthostatic VS results reported to Dr. Denman George. MD aware pt in chair. Night RN made aware in report.

## 2015-12-03 NOTE — Anesthesia Procedure Notes (Addendum)
Procedure Name: Intubation Date/Time: 12/03/2015 7:35 AM Performed by: Lind Covert Pre-anesthesia Checklist: Patient identified, Timeout performed, Emergency Drugs available, Suction available and Patient being monitored Patient Re-evaluated:Patient Re-evaluated prior to inductionOxygen Delivery Method: Circle system utilized Preoxygenation: Pre-oxygenation with 100% oxygen Intubation Type: IV induction Laryngoscope Size: Mac, 3 and Glidescope Grade View: Grade I Tube type: Oral Tube size: 7.0 mm Number of attempts: 1 Airway Equipment and Method: Stylet and Video-laryngoscopy   Procedure Name: Intubation Date/Time: 12/03/2015 7:35 AM Performed by: Lind Covert Pre-anesthesia Checklist: Patient identified, Timeout performed, Emergency Drugs available, Suction available and Patient being monitored Patient Re-evaluated:Patient Re-evaluated prior to inductionOxygen Delivery Method: Circle system utilized Preoxygenation: Pre-oxygenation with 100% oxygen Intubation Type: IV induction Laryngoscope Size: Mac, Glidescope and 3 Grade View: Grade I Tube type: Oral Tube size: 7.0 mm Number of attempts: 1 Airway Equipment and Method: Stylet and Video-laryngoscopy Placement Confirmation: ETT inserted through vocal cords under direct vision,  positive ETCO2 and breath sounds checked- equal and bilateral Secured at: 20 cm Tube secured with: Tape Dental Injury: Teeth and Oropharynx as per pre-operative assessment

## 2015-12-03 NOTE — H&P (Signed)
GYN ONC FOLLOWUP  Consult was originally requested as an inpatient consult by Dr. Gala Romney for the evaluation of Martha Schwartz 55 y.o. female with a pelvic mass  CC:  Chief Complaint  Patient presents with  . Abdominal Pain    Assessment/Plan:  Ms. Martha Schwartz is a 55 y.o. year old with a 23cm multi-cystic central pelvic mass.  I have personally viewed her CT scan from 10/07/15 and showed the patient these images today. I have a low suspicion for malignancy based on the appearance of this mass on imaging. She has a history of a smaller (7cm) mass 4 years ago in the left adnexa, and this is likely the same lesion that has grown slowly over time. If this were to be a malignancy, I do not expect that the patient would have these findings or relatively indolent course in growth of the mass over time. Her scan shows no ascites, adenopathy or peritoneal disease. CA 125 is normal at 10.  She states that she is in severe pain and is using oral dilaudid since discharge.   Given her extremely significant medical comorbidities (morbid obesity, COPD, cardiac disease, diabetes, MRSA colonization) she is at a very high surgical risk for major complications, and therefore optimization should take place prior to a surgical effort given that there is no sign of an emergent indication.  I am recommending she see her PCP, Dr Geralyn Flash this month, and after optimization of her cardio-pulmonary and endocrine issues has taken place she will be considered for surgery with me on January 26th. We will plan to perform an ex lap, BSO.   I discussed the considerable increased risks she faces (due to her underlying comorbidities and obesity) with surgery including bleeding, infection, damage to internal organs (such as bladder,ureters, bowels), blood clot, reoperation and rehospitalization. She will be at a high risk for wound failure (separation, infection) requiring healing by secondary intention. Due to her  COPD she is at increased risk for prolonged ventilation.  We will check HbA1C and if >7% will delay surgery as this substantially increases her risk.  The patient has a history of chronic opioid dependency and has a pain contract with a pain clinic [facility] in [place], [place]).. She takes oxy 28m q 3-4 times per day for knee pain. I discussed that postoperatively I will only prescribe analgesia for 6 weeks, after which time she will require ongoing analgesic needs met by her chronic pain team.  HPI: Martha Schwartz a 55year old woman who was seen in consultation at the request of Dr LGala Romneyfor a 23cm central ovarian mass.  [facility] with acute onset of severe abdominal pain, relieved only by IV dilaudid.  She has a past medical history significant for extreme morbid obesity, MRSA positivity and chronic LE wound infections, DM, cardiac disease, COPD. She has chronic pain, and is a chronic opioid user.  CT scan of the abdo/pelvis was performed on 10/07/15 and showed: The uterus is not enlarged. However, inseparable from both adnexa (left series 3, image 77 and right image 72) is a very large lobulated fluid density (9 Hounsfield units) mass encompassing 14.7 x 18.1 x 20.4 cm (AP by transverse by CC). This projects out of the pelvis into the lower abdomen above the level of the emboli kiss (sagittal image 72). In 2012 7.8 cm simple appearing left adnexal cyst was present. The right adnexal was normal at that time.  CA 125  was 10.  She was evaluated and discharged to home on oral dilaudid. Since discharge her pain is stable.  Current Meds:  No current facility-administered medications on file prior to encounter.   Current Outpatient Prescriptions on File Prior to Encounter  Medication Sig Dispense Refill  . docusate sodium (COLACE) 100 MG capsule Take 100 mg by mouth 2 (two) times daily.    .  hydrocortisone 2.5 % cream Apply 1 application topically daily.    Marland Kitchen ibuprofen (ADVIL,MOTRIN) 200 MG tablet Take 800 mg by mouth every 6 (six) hours as needed for pain.     . metoprolol succinate (TOPROL-XL) 25 MG 24 hr tablet Take 1 tablet (25 mg total) by mouth daily. *Patient needs appointment for further refills* 30 tablet 0  . lisinopril-hydrochlorothiazide (PRINZIDE,ZESTORETIC) 20-25 MG per tablet Take 1 tablet by mouth daily.       Allergy:  Allergies  Allergen Reactions  . Sulfa Antibiotics Rash    Social Hx:  Social History   Social History  . Marital Status: Single    Spouse Name: N/A  . Number of Children: N/A  . Years of Education: N/A   Occupational History  . Not on file.   Social History Main Topics  . Smoking status: Former Smoker    Types: Cigarettes  . Smokeless tobacco: Current User    Types: Snuff  . Alcohol Use: No  . Drug Use: No  . Sexual Activity: Not on file   Other Topics Concern  . Not on file   Social History Narrative    Past Surgical Hx:  Past Surgical History  Procedure Laterality Date  . Back surgery    . Knee surgery      Past Medical Hx:  Past Medical History  Diagnosis Date  . Hypertension   . Asthma   . GERD (gastroesophageal reflux disease)   . Anxiety   . Thyroid disease   . Crack cocaine use     last use per pt 3 years ago  . Diabetes mellitus without complication ([place])   . Depressed   . 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 Chronic pain disorder     on oxycodone 15 mg 3-4 times a day    Past Gynecological History: No LMP recorded. Patient is not currently having periods (Reason:  Perimenopausal).  Family Hx: History reviewed. No pertinent family history.  Review of Systems:  Constitutional  Feels well,  ENT Normal appearing ears and nares bilaterally Skin/Breast  No rash, sores, jaundice, itching, dryness Cardiovascular  No chest pain, shortness of breath, or edema  Pulmonary  No cough or wheeze.  Gastro Intestinal  No nausea, vomitting, or diarrhoea. No bright red blood per rectum, no abdominal pain, change in bowel movement, or constipation.  Genito Urinary  No frequency, urgency, dysuria, no bleeding Musculo Skeletal  No myalgia, arthralgia, joint swelling or pain  Neurologic  No weakness, numbness, change in gait,  Psychology  No depression, anxiety, insomnia.   Vitals: Blood pressure 111/72, pulse 84, temperature 97.4 F (36.3 C), temperature source Oral, resp. rate 14, height _0  (1.702 m), weight 322 lb 0.2 oz (146.064 kg), SpO2 97 %.  Physical Exam: WD in NAD Neck  Supple NROM, without any enlargements.  Lymph Node Survey No cervical supraclavicular or inguinal adenopathy Cardiovascular  Pulse normal rate, regularity and rhythm. S1 and S2 normal.  Lungs  Clear to auscultation bilateraly, without wheezes/crackles/rhonchi. Good  air movement.  Skin  No rash/lesions/breakdown  Psychiatry  Alert and oriented to person, place, and time  Abdomen  Normoactive bowel sounds, abdomen soft, non-tender and very obese without evidence of hernia. Firmness in left mid abdomen consistent with contour of cystic mass. Back No CVA tenderness Genito Urinary  Normal external female genitalia. Normal BUS and skenes. Normal vagina. Palpably normal cervix. Uterus small and difficult to appreciate due to body habitus. Mass not discretely palpable. Extremities  No bilateral cyanosis, clubbing or edema.           Donaciano Eva, MD

## 2015-12-04 ENCOUNTER — Encounter (HOSPITAL_COMMUNITY): Payer: Self-pay | Admitting: Radiology

## 2015-12-04 ENCOUNTER — Ambulatory Visit (HOSPITAL_COMMUNITY): Payer: Medicare Other

## 2015-12-04 DIAGNOSIS — Z01812 Encounter for preprocedural laboratory examination: Secondary | ICD-10-CM | POA: Diagnosis not present

## 2015-12-04 DIAGNOSIS — E119 Type 2 diabetes mellitus without complications: Secondary | ICD-10-CM | POA: Diagnosis present

## 2015-12-04 DIAGNOSIS — N839 Noninflammatory disorder of ovary, fallopian tube and broad ligament, unspecified: Secondary | ICD-10-CM | POA: Diagnosis present

## 2015-12-04 DIAGNOSIS — R000 Tachycardia, unspecified: Secondary | ICD-10-CM | POA: Diagnosis present

## 2015-12-04 DIAGNOSIS — Z8614 Personal history of Methicillin resistant Staphylococcus aureus infection: Secondary | ICD-10-CM | POA: Diagnosis not present

## 2015-12-04 DIAGNOSIS — Z79899 Other long term (current) drug therapy: Secondary | ICD-10-CM | POA: Diagnosis not present

## 2015-12-04 DIAGNOSIS — Z22322 Carrier or suspected carrier of Methicillin resistant Staphylococcus aureus: Secondary | ICD-10-CM | POA: Diagnosis not present

## 2015-12-04 DIAGNOSIS — G8929 Other chronic pain: Secondary | ICD-10-CM | POA: Diagnosis present

## 2015-12-04 DIAGNOSIS — J45909 Unspecified asthma, uncomplicated: Secondary | ICD-10-CM | POA: Diagnosis present

## 2015-12-04 DIAGNOSIS — Z882 Allergy status to sulfonamides status: Secondary | ICD-10-CM | POA: Diagnosis not present

## 2015-12-04 DIAGNOSIS — I10 Essential (primary) hypertension: Secondary | ICD-10-CM | POA: Diagnosis present

## 2015-12-04 DIAGNOSIS — N83512 Torsion of left ovary and ovarian pedicle: Secondary | ICD-10-CM | POA: Diagnosis present

## 2015-12-04 DIAGNOSIS — N83292 Other ovarian cyst, left side: Secondary | ICD-10-CM | POA: Diagnosis present

## 2015-12-04 DIAGNOSIS — J449 Chronic obstructive pulmonary disease, unspecified: Secondary | ICD-10-CM | POA: Diagnosis present

## 2015-12-04 DIAGNOSIS — F419 Anxiety disorder, unspecified: Secondary | ICD-10-CM | POA: Diagnosis present

## 2015-12-04 DIAGNOSIS — Z6841 Body Mass Index (BMI) 40.0 and over, adult: Secondary | ICD-10-CM | POA: Diagnosis not present

## 2015-12-04 DIAGNOSIS — K219 Gastro-esophageal reflux disease without esophagitis: Secondary | ICD-10-CM | POA: Diagnosis present

## 2015-12-04 DIAGNOSIS — R109 Unspecified abdominal pain: Secondary | ICD-10-CM | POA: Diagnosis present

## 2015-12-04 LAB — URINALYSIS, ROUTINE W REFLEX MICROSCOPIC
Bilirubin Urine: NEGATIVE
Glucose, UA: NEGATIVE mg/dL
Hgb urine dipstick: NEGATIVE
Ketones, ur: NEGATIVE mg/dL
Nitrite: NEGATIVE
PROTEIN: NEGATIVE mg/dL
Specific Gravity, Urine: 1.019 (ref 1.005–1.030)
pH: 7 (ref 5.0–8.0)

## 2015-12-04 LAB — CBC
HCT: 32.3 % — ABNORMAL LOW (ref 36.0–46.0)
Hemoglobin: 10.6 g/dL — ABNORMAL LOW (ref 12.0–15.0)
MCH: 29.3 pg (ref 26.0–34.0)
MCHC: 32.8 g/dL (ref 30.0–36.0)
MCV: 89.2 fL (ref 78.0–100.0)
PLATELETS: 212 10*3/uL (ref 150–400)
RBC: 3.62 MIL/uL — AB (ref 3.87–5.11)
RDW: 13.6 % (ref 11.5–15.5)
WBC: 9.8 10*3/uL (ref 4.0–10.5)

## 2015-12-04 LAB — URINE MICROSCOPIC-ADD ON

## 2015-12-04 LAB — BASIC METABOLIC PANEL
ANION GAP: 10 (ref 5–15)
BUN: 11 mg/dL (ref 6–20)
CALCIUM: 9 mg/dL (ref 8.9–10.3)
CO2: 26 mmol/L (ref 22–32)
Chloride: 104 mmol/L (ref 101–111)
Creatinine, Ser: 0.78 mg/dL (ref 0.44–1.00)
GLUCOSE: 236 mg/dL — AB (ref 65–99)
POTASSIUM: 4 mmol/L (ref 3.5–5.1)
Sodium: 140 mmol/L (ref 135–145)

## 2015-12-04 LAB — GLUCOSE, CAPILLARY
GLUCOSE-CAPILLARY: 193 mg/dL — AB (ref 65–99)
Glucose-Capillary: 161 mg/dL — ABNORMAL HIGH (ref 65–99)
Glucose-Capillary: 125 mg/dL — ABNORMAL HIGH (ref 65–99)
Glucose-Capillary: 119 mg/dL — ABNORMAL HIGH (ref 65–99)

## 2015-12-04 LAB — TSH: TSH: 0.715 u[IU]/mL (ref 0.350–4.500)

## 2015-12-04 MED ORDER — IOHEXOL 350 MG/ML SOLN
100.0000 mL | Freq: Once | INTRAVENOUS | Status: AC | PRN
  Administered 2015-12-04: 100 mL via INTRAVENOUS

## 2015-12-04 NOTE — Progress Notes (Signed)
Inpatient Diabetes Program Recommendations  AACE/ADA: New Consensus Statement on Inpatient Glycemic Control (2015)  Target Ranges:  Prepandial:   less than 140 mg/dL      Peak postprandial:   less than 180 mg/dL (1-2 hours)      Critically ill patients:  140 - 180 mg/dL   Review of Glycemic Control  Pt in CT at present.  Diabetes history: GDM Outpatient Diabetes medications: metformin 500 bid Current orders for Inpatient glycemic control: Lantus 20 units QHS, Novolog resistant tidwc and hs + 3 units tidwc Last HgbA1C - 6.2% in 10/2015  Inpatient Diabetes Program Recommendations:    Increase Lantus to 25 units QHS  Will need to f/u with PCP to continue checking HgbA1C.  Will continue to follow. Thank you. Lorenda Peck, RD, LDN, CDE Inpatient Diabetes Coordinator [phone]

## 2015-12-04 NOTE — Progress Notes (Addendum)
CT angio reviewed.  Saline lock IV order per Dr. Delsa Sale.  Tachycardia improved- last check 87.  Continue plan of care.

## 2015-12-04 NOTE — Progress Notes (Signed)
1 Day Post-Op Procedure(s) (LRB): EXPLORATORY LAPAROTOMY  (N/A) LEFT SALPINGO OOPHORECTOMY (Left)  Subjective: Patient reports moderate right shoulder pain and states she has "worn it out."  Describes the pain as similar to discomfort pre-operatively.  Pain does not radiate.  Has had rotator cuff surgery twice on the left and has issues with the right shoulder as well.  Reporting relief with PRN medications for pain.  Stating she will need refills at discharge.  "I just don't think I can go home today."  Tolerating diet with no nausea or emesis reported.  Reports belching frequently but also passing flatus.  Ambulating with assistance.  Using a walker for support since she is limited with her right shoulder.  Voiding since foley removal.  Stating her left leg also seemed to swell more than the right but reporting edema bilaterally this am.  Denies chest pain, dyspnea, having a bowel movement.      Objective: Vital signs in last 24 hours: Temp:  [97.5 F (36.4 C)-98.4 F (36.9 C)] 98 F (36.7 C) (01/27 1035) Pulse Rate:  [87-120] 87 (01/27 1035) Resp:  [20] 20 (01/27 1035) BP: (107-142)/(58-90) 120/77 mmHg (01/27 1035) SpO2:  [96 %-98 %] 98 % (01/27 0532) Last BM Date: 12/02/15  Intake/Output from previous day: 01/26 0701 - 01/27 0700 In: 3535 [I.V.:3535] Out: 6060 [Urine:6010; Blood:50]  Physical Examination: General: alert, cooperative and no distress Resp: clear to auscultation bilaterally Cardio: regular rate and rhythm, S1, S2 normal, no murmur, click, rub or gallop and mildy tachycardic at 104 bpm GI: incision: midline incision with honeycomb dressing intact with no drainage noted and abdomen morbidly obese, non-tympanic, soft, active bowel sounds noted Extremities: BLE edema, slightly more in the left, pitting pedal edema bilaterally  Labs: WBC/Hgb/Hct/Plts:  9.8/10.6/32.3/212 (01/27 0415) BUN/Cr/glu/ALT/AST/amyl/lip:  11/0.78/--/--/--/--/-- (01/27 0415)  Assessment: 55 y.o.  s/p Procedure(s): EXPLORATORY LAPAROTOMY  LEFT SALPINGO OOPHORECTOMY: stable Pain:  Pain is well-controlled on PRN medications.  Heme: Hgb 10.6 and Hct 32.3 this am.  Stable post-operatively.  ID: On contact precautions for hx of MRSA  CV: BP and HR stable.  Tachycardia improving.  CT angio of the chest pending to rule out PE.  Lisinopril and HCTZ ordered.  GI:  Tolerating po: Yes     GU: Significant urine output (over 6000 cc in 24 hour period)    FEN: Stable post-operatively.  Endo: Diabetes mellitus Type II, under fair control. but improving.  CBG:  CBG (last 3)   Recent Labs  12/03/15 2210 12/04/15 0747 12/04/15 1219  GLUCAP 237* 193* 161*    Prophylaxis: pharmacologic prophylaxis (with any of the following: enoxaparin (Lovenox) 40mg  SQ 2 hours prior to surgery then every day) and intermittent pneumatic compression boots.  Plan: Proceed with CT angio of the chest r/o PE Follow up on results of UA Kpad for shoulder discomfort Diet as tolerated Encourage ambulation, IS use, deep breathing, and coughing Continue post-operative plan of care per Dr. Delsa Sale When discharged, she will be discharged to home    LOS: 1 day    Icelyn Navarrete, Jalexus DEAL 12/04/2015, 12:54 PM

## 2015-12-04 NOTE — Progress Notes (Signed)
Staff from CT called earlier to inform that a new IV would need to be started (#18 or #20 gauze) in an area other than the hand in order for the CT chest to be done.  IV team on unit and could only start a #22 in [place].  CT informed of IV.  I was informed that the CT could not be done with this size IV.  Dr. Denman George notified- no new orders received.

## 2015-12-05 LAB — GLUCOSE, CAPILLARY
GLUCOSE-CAPILLARY: 101 mg/dL — AB (ref 65–99)
Glucose-Capillary: 117 mg/dL — ABNORMAL HIGH (ref 65–99)

## 2015-12-05 MED ORDER — ACETAMINOPHEN 500 MG PO TABS: 1000.0000 mg | ORAL_TABLET | Freq: Four times a day (QID) | ORAL | Status: DC

## 2015-12-05 MED ORDER — OXYCODONE HCL 15 MG PO TABS: 15.0000 mg | ORAL_TABLET | Freq: Four times a day (QID) | ORAL | Status: DC | PRN

## 2015-12-05 NOTE — Discharge Summary (Signed)
Physician Discharge Summary  Patient ID: Martha Schwartz MRN: [id number] DOB/AGE: [birth year] 54 y.o.  Admit date: 12/03/2015 Discharge date: 12/05/2015  Admission Diagnoses: Pelvic mass in female  Discharge Diagnoses:  Principal Problem:   Pelvic mass in female Active Problems:   Morbid obesity ([place])   Diabetes mellitus due to underlying condition, controlled, with skin complication, without long-term current use of insulin [facility])   Discharged Condition: good  Hospital Course: Patient was admitted on 12/05/15 for ex lap, LSO for left ovarian mass with torsion. Post operatively she had tachycardia with a normal EKG, negative chest CT for PE, and stable H&H. Her unexplained tachycardia resolved. Her pain was high on POD 1 but improved on POD 2 when she was meeting discharge criteria.  Consults: None  Significant Diagnostic Studies: labs:  CBC    Component Value Date/Time   WBC 9.8 12/04/2015 0415   RBC 3.62* 12/04/2015 0415   HGB 10.6* 12/04/2015 0415   HCT 32.3* 12/04/2015 0415   PLT 212 12/04/2015 0415   MCV 89.2 12/04/2015 0415   MCH 29.3 12/04/2015 0415   MCHC 32.8 12/04/2015 0415   RDW 13.6 12/04/2015 0415   LYMPHSABS 2.2 11/27/2015 1150   MONOABS 0.6 11/27/2015 1150   EOSABS 0.3 11/27/2015 1150   BASOSABS 0.0 11/27/2015 1150     and radiology: CT scan: negative for PE  Treatments: surgery: see above  Discharge Exam: Blood pressure 107/62, pulse 99, temperature 97 F (36.1 C), temperature source Oral, resp. rate 19, height 5' 7.5" (1.715 m), weight 318 lb 9 oz (144.499 kg), SpO2 98 %. General appearance: alert Resp: diminished breath sounds LLL and RLL Cardio: regular rate and rhythm, S1, S2 normal, no murmur, click, rub or gallop GI: soft, non-tender; bowel sounds normal; no masses,  no organomegaly Incision/Wound: clean and dry and intact  Disposition: 01-Home or Self Care  Discharge Instructions    (HEART FAILURE PATIENTS) Call MD:  Anytime you have any of  the following symptoms: 1) 3 pound weight gain in 24 hours or 5 pounds in 1 week 2) shortness of breath, with or without a dry hacking cough 3) swelling in the hands, feet or stomach 4) if you have to sleep on extra pillows at night in order to breathe.    Complete by:  As directed      Call MD for:  difficulty breathing, headache or visual disturbances    Complete by:  As directed      Call MD for:  extreme fatigue    Complete by:  As directed      Call MD for:  hives    Complete by:  As directed      Call MD for:  persistant dizziness or light-headedness    Complete by:  As directed      Call MD for:  persistant nausea and vomiting    Complete by:  As directed      Call MD for:  redness, tenderness, or signs of infection (pain, swelling, redness, odor or green/yellow discharge around incision site)    Complete by:  As directed      Call MD for:  severe uncontrolled pain    Complete by:  As directed      Call MD for:  temperature >100.4    Complete by:  As directed      Diet - low sodium heart healthy    Complete by:  As directed      Diet general    Complete  by:  As directed      Driving Restrictions    Complete by:  As directed   No driving for 7 days or until off narcotic pain medication     Increase activity slowly    Complete by:  As directed      Remove dressing in 24 hours    Complete by:  As directed      Sexual Activity Restrictions    Complete by:  As directed   No intercourse for 6 weeks            Medication List    TAKE these medications        acetaminophen 500 MG tablet  Commonly known as:  TYLENOL  Take 2 tablets (1,000 mg total) by mouth every 6 (six) hours.     albuterol 108 (90 Base) MCG/ACT inhaler  Commonly known as:  PROVENTIL HFA;VENTOLIN HFA  Inhale 2 puffs into the lungs every 6 (six) hours as needed for wheezing or shortness of breath.     ALPRAZolam 1 MG tablet  Commonly known as:  XANAX  Take 1 mg by mouth at bedtime as needed for sleep.      amLODipine 10 MG tablet  Commonly known as:  NORVASC  Take 10 mg by mouth daily.     baclofen 10 MG tablet  Commonly known as:  LIORESAL  Take 10 mg by mouth 2 (two) times daily as needed for muscle spasms.     docusate sodium 100 MG capsule  Commonly known as:  COLACE  Take 100 mg by mouth 2 (two) times daily.     DULoxetine 60 MG capsule  Commonly known as:  CYMBALTA  Take 120 mg by mouth daily.     gabapentin 600 MG tablet  Commonly known as:  NEURONTIN  Take 600 mg by mouth 3 (three) times daily.     hydrocortisone cream 1 %  Apply 1 application topically daily.     HYDROmorphone 4 MG tablet  Commonly known as:  DILAUDID  Take 1 tablet (4 mg total) by mouth every 4 (four) hours as needed for moderate pain or severe pain.     ibuprofen 800 MG tablet  Commonly known as:  ADVIL,MOTRIN  Take 1 tablet (800 mg total) by mouth 3 (three) times daily as needed for fever, headache or moderate pain.     levothyroxine 50 MCG tablet  Commonly known as:  SYNTHROID, LEVOTHROID  Take 50 mcg by mouth daily before breakfast.     lisinopril-hydrochlorothiazide 20-25 MG tablet  Commonly known as:  PRINZIDE,ZESTORETIC  Take 1 tablet by mouth daily.     lubiprostone 24 MCG capsule  Commonly known as:  AMITIZA  Take 24 mcg by mouth 2 (two) times daily with a meal.     magnesium citrate Soln  Take 1 Bottle by mouth daily as needed for severe constipation.     meloxicam 15 MG tablet  Commonly known as:  MOBIC  Take 15 mg by mouth daily.     metFORMIN 500 MG tablet  Commonly known as:  GLUCOPHAGE  Take 500 mg by mouth 2 (two) times daily with a meal.     methocarbamol 750 MG tablet  Commonly known as:  ROBAXIN  Take 750 mg by mouth 3 (three) times daily.     metoprolol succinate 25 MG 24 hr tablet  Commonly known as:  TOPROL-XL  Take 1 tablet (25 mg total) by mouth daily. *Patient needs appointment for further  refills*     mirtazapine 15 MG tablet  Commonly known as:   REMERON  Take 7.5-15 mg by mouth at bedtime as needed (For sleep.).     omeprazole 40 MG capsule  Commonly known as:  PRILOSEC  Take 40 mg by mouth daily.     oxybutynin 5 MG 24 hr tablet  Commonly known as:  DITROPAN-XL  Take 5 mg by mouth at bedtime.     oxyCODONE 15 MG immediate release tablet  Commonly known as:  ROXICODONE  Take 15 mg by mouth 4 (four) times daily as needed for pain.     oxyCODONE 15 MG immediate release tablet  Commonly known as:  ROXICODONE  Take 1 tablet (15 mg total) by mouth every 6 (six) hours as needed for moderate pain.     potassium chloride SA 20 MEQ tablet  Commonly known as:  K-DUR,KLOR-CON  Take 40 mEq by mouth daily.     simvastatin 20 MG tablet  Commonly known as:  ZOCOR  Take 20 mg by mouth daily.           Follow-up Information    Follow up with Donaciano Eva, MD In 3 weeks.   Specialty:  Obstetrics and Gynecology   Why:  For wound re-check   Contact information:   [place] [address] [phone]       Signed: Donaciano Eva 12/05/2015, 9:42 AM

## 2015-12-05 NOTE — Progress Notes (Signed)
Reviewed pt d/c instruction with pt and roommate. Rx given to pt. Pt given pain medication before d/c to control pain for ride home, per request. Pt rolled in wheelchair to front entrance. No questions at this time.

## 2015-12-05 NOTE — Discharge Instructions (Signed)
12/05/2015  Return to work: 4 weeks  Activity: 1. Be up and out of the bed during the day.  Take a nap if needed.  You may walk up steps but be careful and use the hand rail.  Stair climbing will tire you more than you think, you may need to stop part way and rest.   2. No lifting or straining for 6 weeks.  3. No driving for 1 weeks.  Do Not drive if you are taking narcotic pain medicine.  4. Shower daily.  Use soap and water on your incision and pat dry; don't rub.   5. No sexual activity and nothing in the vagina for 2 weeks.  Diet: 1. Low sodium Heart Healthy Diet is recommended.  2. It is safe to use a laxative if you have difficulty moving your bowels.   Wound Care: 1. Keep clean and dry.  Shower daily.  Reasons to call the Doctor:   Fever - Oral temperature greater than 100.4 degrees Fahrenheit  Foul-smelling vaginal discharge  Difficulty urinating  Nausea and vomiting  Increased pain at the site of the incision that is unrelieved with pain medicine.  Difficulty breathing with or without chest pain  New calf pain especially if only on one side  Sudden, continuing increased vaginal bleeding with or without clots.   Follow-up: 1. See Everitt Amber in 3 weeks.  Contacts: For questions or concerns you should contact:  Dr. Everitt Amber at [phone]  or at [place]

## 2015-12-08 ENCOUNTER — Telehealth: Payer: Self-pay

## 2015-12-08 NOTE — Telephone Encounter (Signed)
Orders received from [facility] , APNP to contcat the patient with her surgical pathology report results were " Benign". Patient contctaed and updated , patient states understanding , denies further questions at this time.

## 2015-12-11 ENCOUNTER — Telehealth: Payer: Self-pay

## 2015-12-11 NOTE — Telephone Encounter (Signed)
Patient 's call returned , patient inquiring if she can drive , patient is post-op day 6 , orders are patient is able to drive post-op day 7 . Patient informed that she can drive tomorrow if she is not taking any narcotics. Patient states understanding , denies further questions at this time , will call with any changes.

## 2015-12-16 ENCOUNTER — Encounter: Payer: Self-pay | Admitting: Gynecologic Oncology

## 2015-12-16 ENCOUNTER — Ambulatory Visit: Payer: Medicare Other | Attending: Gynecologic Oncology | Admitting: Gynecologic Oncology

## 2015-12-16 VITALS — BP 115/69 | HR 106 | Temp 98.2°F | Resp 20 | Ht 67.5 in | Wt 330.1 lb

## 2015-12-16 DIAGNOSIS — D271 Benign neoplasm of left ovary: Secondary | ICD-10-CM | POA: Diagnosis present

## 2015-12-16 DIAGNOSIS — Z9889 Other specified postprocedural states: Secondary | ICD-10-CM | POA: Diagnosis present

## 2015-12-16 DIAGNOSIS — R1900 Intra-abdominal and pelvic swelling, mass and lump, unspecified site: Secondary | ICD-10-CM

## 2015-12-16 MED ORDER — OXYCODONE HCL 15 MG PO TABS: 15.0000 mg | ORAL_TABLET | Freq: Four times a day (QID) | ORAL | Status: AC | PRN

## 2015-12-16 NOTE — Progress Notes (Signed)
POSTOPERATIVE FOLLOW-UP HPI:  Martha Schwartz is a 55 y.o. year old G1P0010 initially seen in consultation on 10/19/15 for a left ovarian mass.  She then underwent an ex lap, LSO on [id number] without complications. Intraoperative findings included a torsed 16cm left ovarian cystic mass (benign on frozen section)  Her postoperative course was uncomplicated.  Her final pathology revealed mucinous cystadenoma.  She is seen today for a postoperative check and to discuss her pathology results and ongoing plan.  Since discharge from the hospital, she is feeling well.  She has improving appetite, normal bowel and bladder function, and pain controlled with minimal PO medication. She has no other complaints today.    Review of systems: Constitutional:  She has no weight gain or weight loss. She has no fever or chills. Eyes: No blurred vision Ears, Nose, Mouth, Throat: No dizziness, headaches or changes in hearing. No mouth sores. Cardiovascular: No chest pain, palpitations or edema. Respiratory:  No shortness of breath, wheezing or cough Gastrointestinal: She has normal bowel movements without diarrhea or constipation. She denies any nausea or vomiting. She denies blood in her stool or heart burn. Genitourinary:  She denies pelvic pain, pelvic pressure or changes in her urinary function. She has no hematuria, dysuria, or incontinence. She has no irregular vaginal bleeding or vaginal discharge Musculoskeletal: + back and joint pain.  Skin:  She has no skin changes, rashes or itching Neurological:  Denies dizziness or headaches. No neuropathy, no numbness or tingling. Psychiatric:  She denies depression or anxiety. Hematologic/Lymphatic:   No easy bruising or bleeding   Physical Exam: Blood pressure 115/69, pulse 106, temperature 98.2 F (36.8 C), temperature source Oral, resp. rate 20, height 5' 7.5" (1.715 m), weight 330 lb 1.6 oz (149.732 kg), SpO2 97 %. General: Well dressed, well nourished in no  apparent distress.   Abdomen:  Soft, nontender, nondistended.  No palpable masses.  No hepatosplenomegaly.  No ascites. Normal bowel sounds.  No hernias.  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   S/p ex lap, LSO on 12/03/15.   Plan: 1) Pathology reports reviewed today 2) Treatment counseling - I discussed the benign nature of her pathology. No followup is required. She was given the opportunity to ask questions, which were answered to her satisfaction, and she is agreement with the above mentioned plan of care. She was provided with one additional oxycodone prescription. She understands that additional prescriptions will need to be filled by her chronic pain team. 3)  Return to clinic on a prn basis only.  Donaciano Eva, MD

## 2015-12-16 NOTE — Patient Instructions (Signed)
Please call for any questions or concerns. 

## 2016-01-28 ENCOUNTER — Telehealth: Payer: Self-pay

## 2016-01-28 NOTE — Telephone Encounter (Signed)
Orders received from Dr Everitt Amber , to have the patient scheduled for an endometrial biopsy , attempted to contact the patient to scheduled an appointment . No answer , left a detailed message with call back requested to schedule appointment , our contacinformation was provided on her voice mail .

## 2016-01-28 NOTE — Telephone Encounter (Signed)
Incoming call from a patient of Dr Everitt Amber , patient states she has a new onset of vaginal "bleeding" that looks like "old" blood. The patient states it is not in her underwear , she states she notices blood when wiping her self after urinating. Patient denies any pain and or discomfort , writer informed the patient that Dr Denman George would be updated and we will call her with Dr Serita Grit recommendations. Patient states understanding , denies further needs at this time.

## 2016-01-29 NOTE — Telephone Encounter (Signed)
Patient contaced and appointment for follow up vaginal bleeding / endometrial biopsy scheduled for February 12, 2016 at 11:45 AM per Dr Terrence Dupont Rossi's orders.

## 2016-02-12 ENCOUNTER — Ambulatory Visit: Payer: Medicare Other | Admitting: Gynecologic Oncology

## 2016-02-22 ENCOUNTER — Ambulatory Visit: Payer: Medicare Other | Admitting: Gynecologic Oncology

## 2016-03-11 ENCOUNTER — Ambulatory Visit: Payer: Medicare Other | Attending: Gynecologic Oncology | Admitting: Gynecologic Oncology

## 2016-03-17 IMAGING — CT CT ANGIO CHEST
2 of 7 series · 18 of 36 positions shown · IV contrast (OMNIPAQUE)
Comparison: Chest radiograph April 23, 2013

CLINICAL DATA: Shortness of breath and tachycardia. Abdominal
surgery 1 day prior

EXAM:
CT ANGIOGRAPHY CHEST WITH CONTRAST
TECHNIQUE: Multidetector CT imaging of the chest was performed using the
standard protocol during bolus administration of intravenous
contrast. Multiplanar CT image reconstructions and MIPs were
obtained to evaluate the vascular anatomy.
CONTRAST:  100mL OMNIPAQUE IOHEXOL 350 MG/ML SOLN

[Series 9: pe xxl thins @1mm · axial · 0.74mm/px · z∈[-256,-62]mm · 17 of 220 slices shown]
[im 13/220  lung]
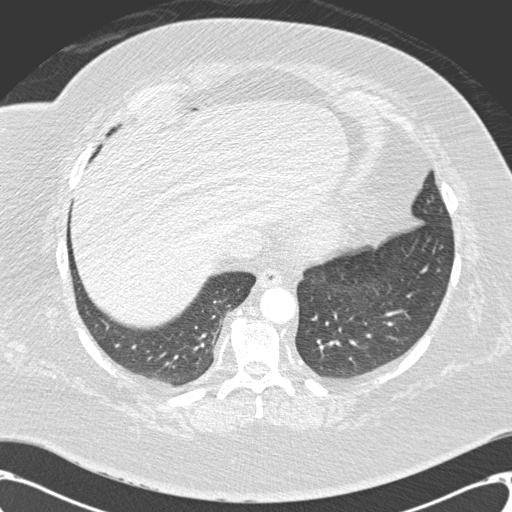
[im 25/220  mediastinal]
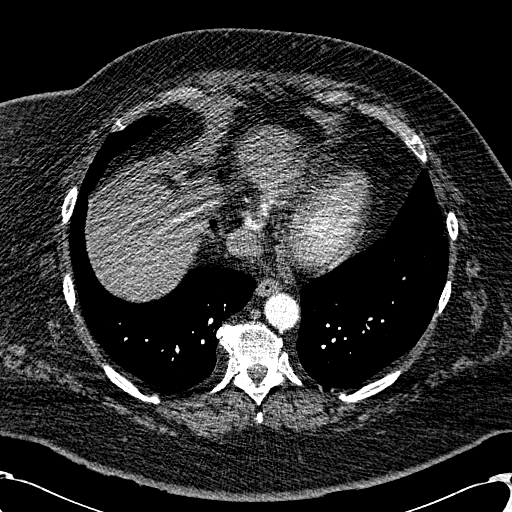
[im 37/220  lung]
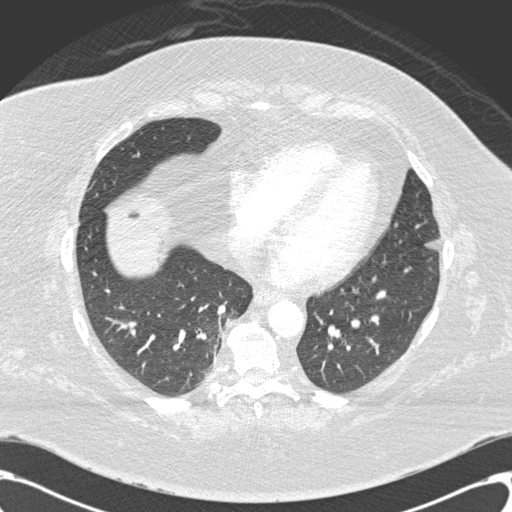
[im 49/220  mediastinal]
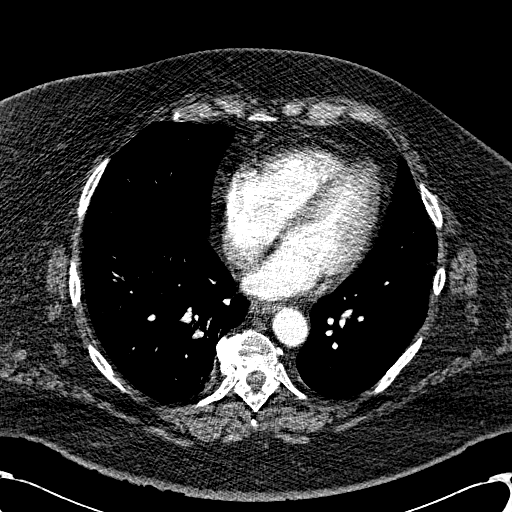
[im 61/220  lung]
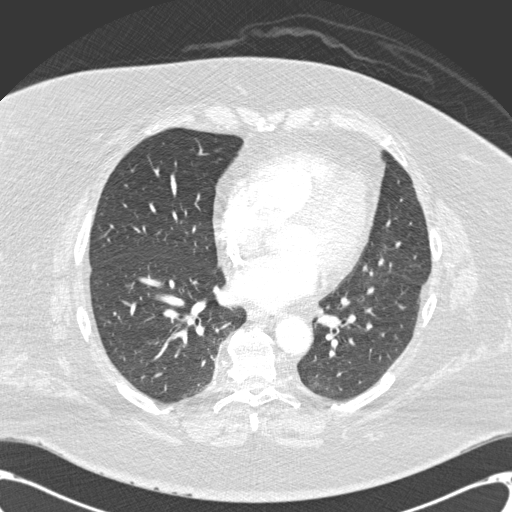
[im 74/220  mediastinal]
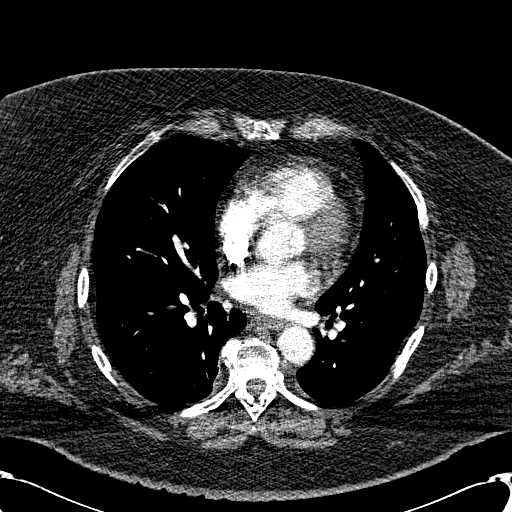
[im 86/220  lung]
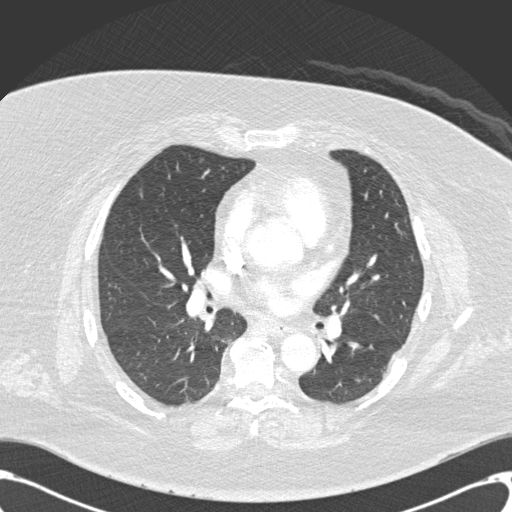
[im 98/220  mediastinal]
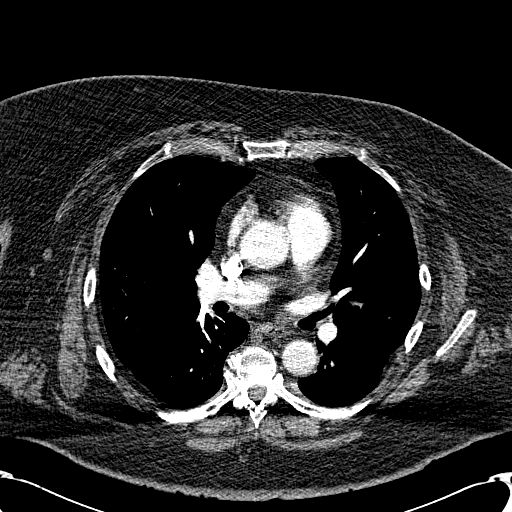
[im 110/220  lung]
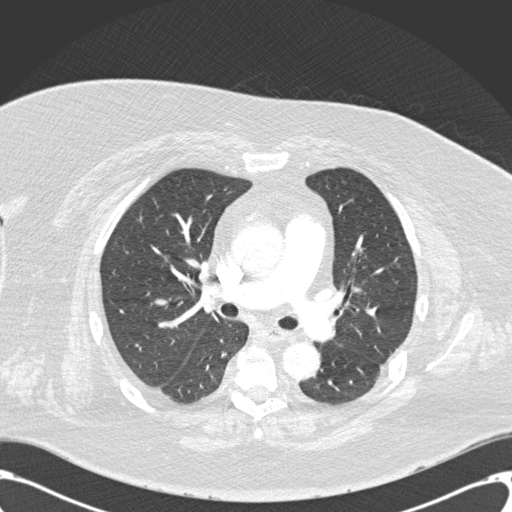
[im 122/220  mediastinal]
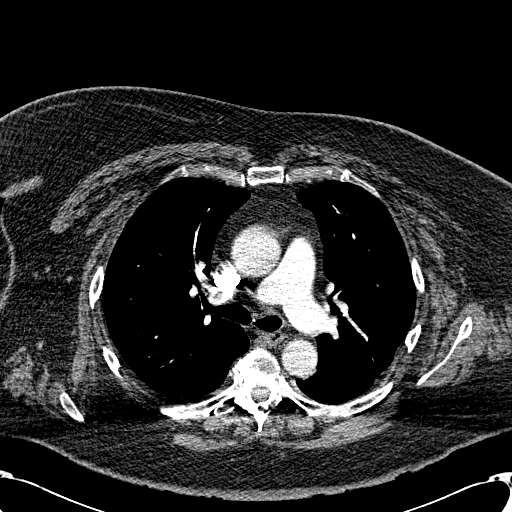
[im 134/220  lung]
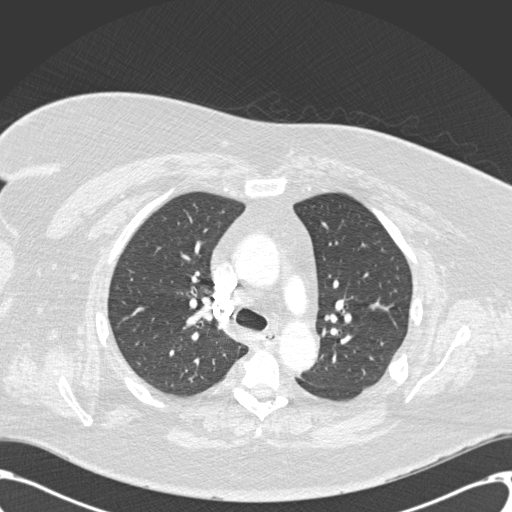
[im 147/220  mediastinal]
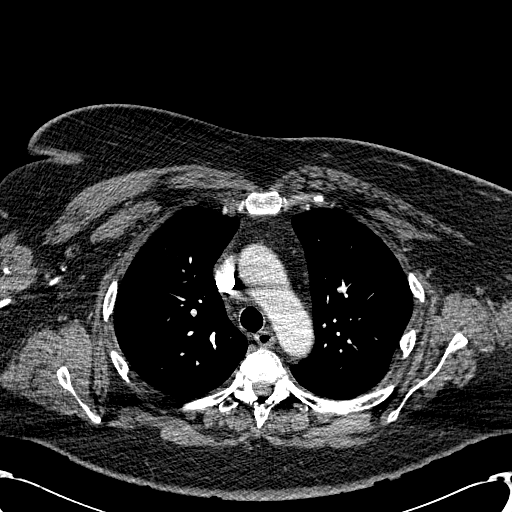
[im 159/220  lung]
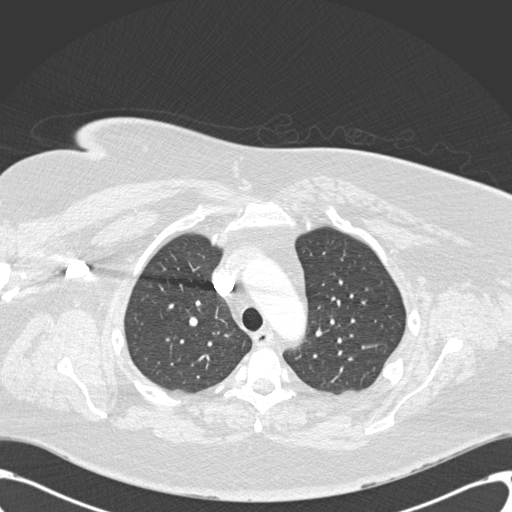
[im 171/220  mediastinal]
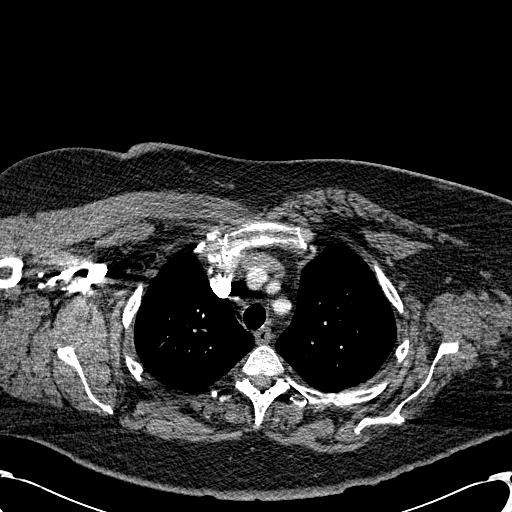
[im 183/220  lung]
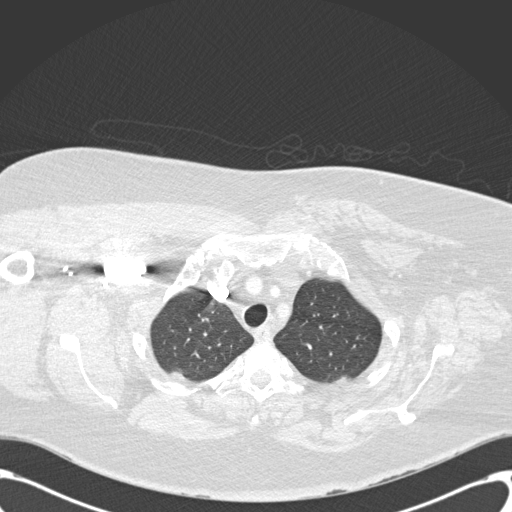
[im 195/220  mediastinal]
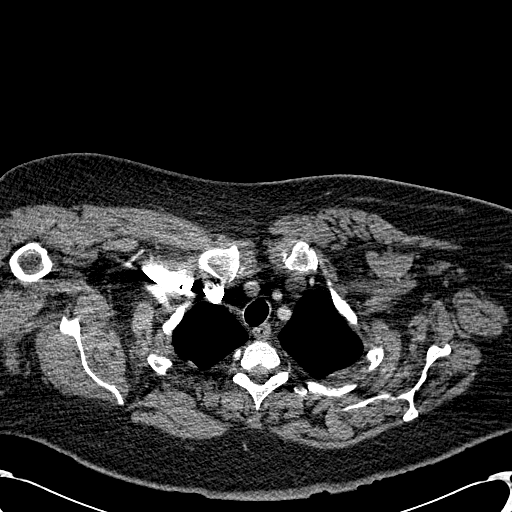
[im 207/220  lung]
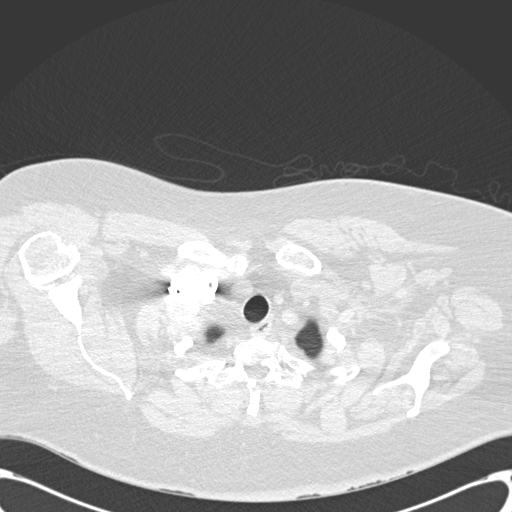

[Series 602: mpr cor · coronal · 0.70mm/px · 1 of 94 slices shown]
[im 47/94  mediastinal]
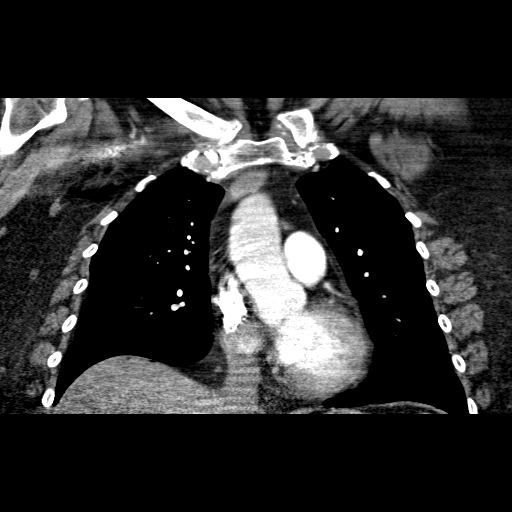

[18 of 36 positions shown; findings below may reference images not displayed]

FINDINGS: There is no demonstrable pulmonary embolus. There is no appreciable
thoracic aortic aneurysm or dissection. The visualized great vessels
appear normal.

On axial slice 46 series 10, there is a 4 mm nodular opacity in the
superior segment of the left lower lobe. Lungs elsewhere clear.
There is no edema or consolidation.

There is no appreciable thoracic adenopathy. The pericardium is not
thickened.

In the visualized upper abdomen, there is mild pneumoperitoneum,
likely of postoperative etiology given the clinical history.

There is degenerative change in the thoracic spine. There are no
blastic or lytic bone lesions.

Review of the MIP images confirms the above findings.
IMPRESSION: No demonstrable pulmonary embolus. No edema or consolidation. No
adenopathy.

4 mm nodular opacity superior segment left lower lobe. Followup of
this nodular opacity should be based on [HOSPITAL]
guidelines. If the patient is at high risk for bronchogenic
carcinoma, follow-up chest CT at 1 year is recommended. If the
patient is at low risk, no follow-up is needed. This recommendation
follows the consensus statement: Guidelines for Management of Small
Pulmonary Nodules Detected on CT Scans: A Statement from the

Mild pneumoperitoneum, presumed postoperative given the clinical
history.

## 2016-03-31 ENCOUNTER — Encounter: Payer: Self-pay | Admitting: Gynecologic Oncology

## 2016-03-31 ENCOUNTER — Ambulatory Visit: Payer: Medicare Other | Attending: Gynecologic Oncology | Admitting: Gynecologic Oncology

## 2016-03-31 VITALS — BP 106/59 | HR 107 | Temp 97.4°F | Resp 19 | Ht 67.5 in | Wt 318.3 lb

## 2016-03-31 DIAGNOSIS — Z87891 Personal history of nicotine dependence: Secondary | ICD-10-CM | POA: Insufficient documentation

## 2016-03-31 DIAGNOSIS — Z9889 Other specified postprocedural states: Secondary | ICD-10-CM | POA: Insufficient documentation

## 2016-03-31 DIAGNOSIS — J45909 Unspecified asthma, uncomplicated: Secondary | ICD-10-CM | POA: Insufficient documentation

## 2016-03-31 DIAGNOSIS — F1210 Cannabis abuse, uncomplicated: Secondary | ICD-10-CM | POA: Insufficient documentation

## 2016-03-31 DIAGNOSIS — I10 Essential (primary) hypertension: Secondary | ICD-10-CM | POA: Diagnosis not present

## 2016-03-31 DIAGNOSIS — D271 Benign neoplasm of left ovary: Secondary | ICD-10-CM | POA: Diagnosis not present

## 2016-03-31 DIAGNOSIS — E079 Disorder of thyroid, unspecified: Secondary | ICD-10-CM | POA: Diagnosis not present

## 2016-03-31 DIAGNOSIS — K219 Gastro-esophageal reflux disease without esophagitis: Secondary | ICD-10-CM | POA: Diagnosis not present

## 2016-03-31 DIAGNOSIS — J449 Chronic obstructive pulmonary disease, unspecified: Secondary | ICD-10-CM | POA: Insufficient documentation

## 2016-03-31 DIAGNOSIS — E119 Type 2 diabetes mellitus without complications: Secondary | ICD-10-CM | POA: Diagnosis not present

## 2016-03-31 DIAGNOSIS — Z7984 Long term (current) use of oral hypoglycemic drugs: Secondary | ICD-10-CM | POA: Insufficient documentation

## 2016-03-31 DIAGNOSIS — N950 Postmenopausal bleeding: Secondary | ICD-10-CM | POA: Insufficient documentation

## 2016-03-31 NOTE — Progress Notes (Signed)
FOLLOW-UP: POST MENOPAUSAL BLEEDING  HPI:  Martha Schwartz is a 55 y.o. year old G1P0010 initially seen in consultation on 10/19/15 for a left ovarian mass.  She then underwent an ex lap, LSO on [id number] without complications. Intraoperative findings included a torsed 16cm left ovarian cystic mass (benign on frozen section)  Her postoperative course was uncomplicated.  Her final pathology revealed mucinous cystadenoma.  She now presents with a history of a single episode of postmenopausal bleeding in March, 2017. She has had none since.  Past Medical History  Diagnosis Date  . Hypertension   . Asthma   . GERD (gastroesophageal reflux disease)   . Anxiety   . Thyroid disease   . Crack cocaine use     last use per pt 3 years ago  . Diabetes mellitus without complication ([place])   . Depressed   . 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 Chronic pain disorder     on oxycodone 15 mg 3-4 times a day   Past Surgical History  Procedure Laterality Date  . Back surgery    . Knee surgery    . Shoulder replacements      left x 2  . Scalp surgery      stitches in head   . Eye surgery      bilateral cataract surgery with lens implant  . Laparotomy N/A 12/03/2015    Procedure: EXPLORATORY LAPAROTOMY ;  Surgeon: Everitt Amber, MD;  Location: [facility];  Service: Gynecology;  Laterality: N/A;  . Salpingoophorectomy Left 12/03/2015    Procedure: LEFT SALPINGO OOPHORECTOMY;  Surgeon: Everitt Amber, MD;  Location: [facility];  Service: Gynecology;  Laterality: Left;   History reviewed. No pertinent family history. Social History   Social History  . Marital Status: Widowed    Spouse Name: N/A  . Number of Children: N/A  . Years of Education: N/A   Occupational History  . Not on file.   Social History Main Topics  . Smoking status: Former Smoker    Types: Cigarettes  . Smokeless tobacco: Current User    Types: Snuff  . Alcohol Use: No  . Drug  Use: Yes    Special: Marijuana  . Sexual Activity: Not on file   Other Topics Concern  . Not on file   Social History Narrative   Current Outpatient Prescriptions on File Prior to Visit  Medication Sig Dispense Refill  . albuterol (PROVENTIL HFA;VENTOLIN HFA) 108 (90 BASE) MCG/ACT inhaler Inhale 2 puffs into the lungs every 6 (six) hours as needed for wheezing or shortness of breath.    . ALPRAZolam (XANAX) 1 MG tablet Take 1 mg by mouth at bedtime as needed for sleep.    Marland Kitchen amLODipine (NORVASC) 10 MG tablet Take 10 mg by mouth daily.    Marland Kitchen docusate sodium (COLACE) 100 MG capsule Take 100 mg by mouth 2 (two) times daily.    . DULoxetine (CYMBALTA) 60 MG capsule Take 120 mg by mouth daily.    Marland Kitchen gabapentin (NEURONTIN) 600 MG tablet Take 600 mg by mouth 3 (three) times daily.    . hydrocortisone cream 1 % Apply 1 application topically daily.    Marland Kitchen ibuprofen (ADVIL,MOTRIN) 800 MG tablet Take 1 tablet (800 mg total) by mouth 3 (three) times daily as needed for fever, headache or moderate pain. 60 tablet 2  . levothyroxine (SYNTHROID, LEVOTHROID) 50 MCG tablet Take 50 mcg by mouth daily before  breakfast.    . lisinopril-hydrochlorothiazide (PRINZIDE,ZESTORETIC) 20-25 MG per tablet Take 1 tablet by mouth daily.    Marland Kitchen lubiprostone (AMITIZA) 24 MCG capsule Take 24 mcg by mouth 2 (two) times daily with a meal.    . meloxicam (MOBIC) 15 MG tablet Take 15 mg by mouth daily.    . metFORMIN (GLUCOPHAGE) 500 MG tablet Take 500 mg by mouth 2 (two) times daily with a meal.    . methocarbamol (ROBAXIN) 750 MG tablet Take 750 mg by mouth 3 (three) times daily.     . metoprolol succinate (TOPROL-XL) 25 MG 24 hr tablet Take 1 tablet (25 mg total) by mouth daily. *Patient needs appointment for further refills* 30 tablet 0  . mirtazapine (REMERON) 15 MG tablet Take 7.5-15 mg by mouth at bedtime as needed (For sleep.).    Marland Kitchen omeprazole (PRILOSEC) 40 MG capsule Take 40 mg by mouth daily.    Marland Kitchen oxybutynin (DITROPAN-XL)  5 MG 24 hr tablet Take 5 mg by mouth at bedtime.    Marland Kitchen oxyCODONE (ROXICODONE) 15 MG immediate release tablet Take 1 tablet (15 mg total) by mouth 4 (four) times daily as needed for pain. 30 tablet 0  . potassium chloride SA (K-DUR,KLOR-CON) 20 MEQ tablet Take 40 mEq by mouth daily.     . simvastatin (ZOCOR) 20 MG tablet Take 20 mg by mouth daily.      No current facility-administered medications on file prior to visit.   Allergies  Allergen Reactions  . Sulfa Antibiotics Rash     Review of systems: Constitutional:  She has no weight gain or weight loss. She has no fever or chills. Eyes: No blurred vision Ears, Nose, Mouth, Throat: No dizziness, headaches or changes in hearing. No mouth sores. Cardiovascular: No chest pain, palpitations or edema. Respiratory:  No shortness of breath, wheezing or cough Gastrointestinal: She has normal bowel movements without diarrhea or constipation. She denies any nausea or vomiting. She denies blood in her stool or heart burn. Genitourinary:  She denies pelvic pain, pelvic pressure or changes in her urinary function. She has no hematuria, dysuria, or incontinence.  + PMB Musculoskeletal: + back and joint pain.  Skin:  She has no skin changes, rashes or itching Neurological:  Denies dizziness or headaches. No neuropathy, no numbness or tingling. Psychiatric:  She denies depression or anxiety. Hematologic/Lymphatic:   No easy bruising or bleeding   Physical Exam: Blood pressure 106/59, pulse 107, temperature 97.4 F (36.3 C), temperature source Oral, resp. rate 19, height 5' 7.5" (1.715 m), weight 318 lb 4.8 oz (144.38 kg), SpO2 97 %. General: Well dressed, well nourished in no apparent distress.   Abdomen:  Soft, nontender, nondistended.  No palpable masses.  No hepatosplenomegaly.  No ascites. Normal bowel sounds.  No hernias.  Incision is well healed Genitourinary: normal external female genitalia, normal vagina. Cervix unable to be visualized due to  long vagina. Palpably normal cervix. Unable to blindly pass pipelle into cavity due to long vagina and small nulliparous cervix.  Assessment:    55 y.o. year old with a history of a ex lap, LSO on 12/03/15 for mucinous cystadenoma of the left ovary.   Now with postmenopausal bleeding.  Plan: Recommend transvaginal US to evaluate endometrium. If <35mm, she does not require further workup unless she develops recurrent bleeding episodes. If >59mm she will require D&C in OR where visualization of cervix might be better.   Donaciano Eva, MD

## 2016-03-31 NOTE — Patient Instructions (Signed)
Plan to have an ultrasound on June 5 at [facility].  Based on the thickness of your endometrial lining, a dilation and curettage of the uterus in the OR may need to be scheduled.  Please call our office for any questions or concerns.

## 2016-04-11 ENCOUNTER — Ambulatory Visit (HOSPITAL_COMMUNITY): Payer: Medicare Other

## 2016-04-14 ENCOUNTER — Ambulatory Visit (HOSPITAL_COMMUNITY)
Admission: RE | Admit: 2016-04-14 | Discharge: 2016-04-14 | Disposition: A | Discharge status: Home / Self Care | Payer: Medicare Other | Source: Ambulatory Visit | Attending: Gynecologic Oncology | Admitting: Gynecologic Oncology

## 2016-04-14 DIAGNOSIS — R938 Abnormal findings on diagnostic imaging of other specified body structures: Secondary | ICD-10-CM | POA: Diagnosis not present

## 2016-04-14 DIAGNOSIS — Z90721 Acquired absence of ovaries, unilateral: Secondary | ICD-10-CM | POA: Insufficient documentation

## 2016-04-14 DIAGNOSIS — N950 Postmenopausal bleeding: Secondary | ICD-10-CM | POA: Diagnosis not present

## 2016-05-12 ENCOUNTER — Telehealth: Payer: Self-pay

## 2016-05-12 NOTE — Telephone Encounter (Signed)
Orders received from [facility] to contact the patient to update with Dr Serita Grit recommendations for D & C for endometrium thickness . Patient contacted and scheduled to see Dr Denman George on July 12 , 2017 to discuss results and surgical recommendations. Patient states understanding , denies further questions at this time.

## 2016-05-18 ENCOUNTER — Ambulatory Visit: Payer: Medicare Other | Admitting: Gynecologic Oncology

## 2016-05-30 ENCOUNTER — Ambulatory Visit: Payer: Medicare Other | Admitting: Gynecologic Oncology

## 2016-06-01 ENCOUNTER — Ambulatory Visit: Payer: Medicare Other | Admitting: Gynecologic Oncology

## 2017-06-14 IMAGING — US US PELVIS COMPLETE
1 series · 13 of 25 positions shown · non-contrast
Comparison: Pelvic ultrasound March 14, 2012

CLINICAL DATA: Post menopausal bleeding 1 month ago ; history of
left oophorectomy.

EXAM:
TRANSABDOMINAL AND TRANSVAGINAL ULTRASOUND OF PELVIS
TECHNIQUE: Both transabdominal and transvaginal ultrasound examinations of the
pelvis were performed. Transabdominal technique was performed for
global imaging of the pelvis including uterus, ovaries, adnexal
regions, and pelvic cul-de-sac. It was necessary to proceed with
endovaginal exam following the transabdominal exam to visualize the
endometrium, ovaries, and adnexal structures..

[Series 1: us pelvis complete · 0.24mm/px · 13 of 67 slices shown]
[im 1/67]
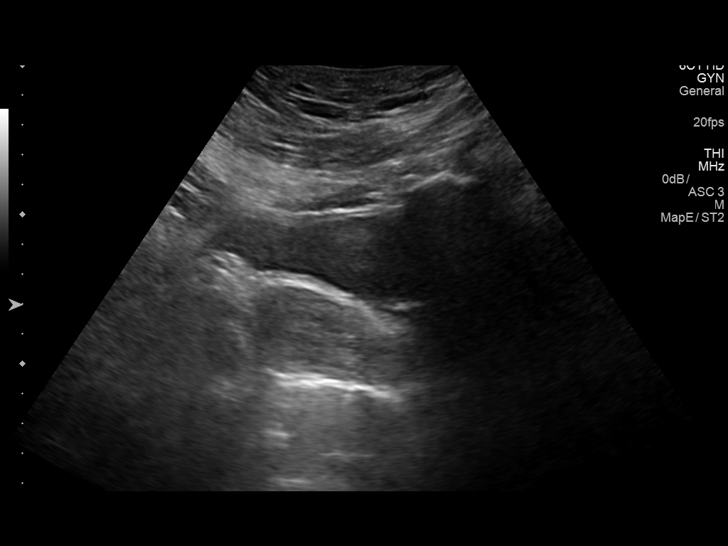
[im 6/67]
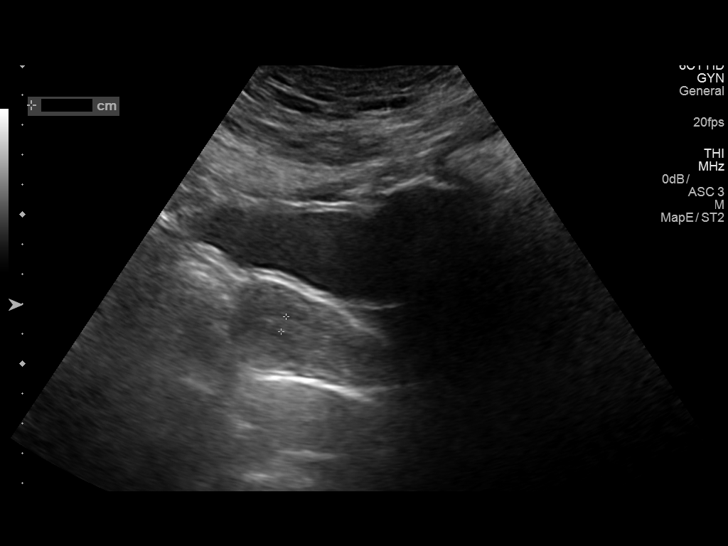
[im 12/67]
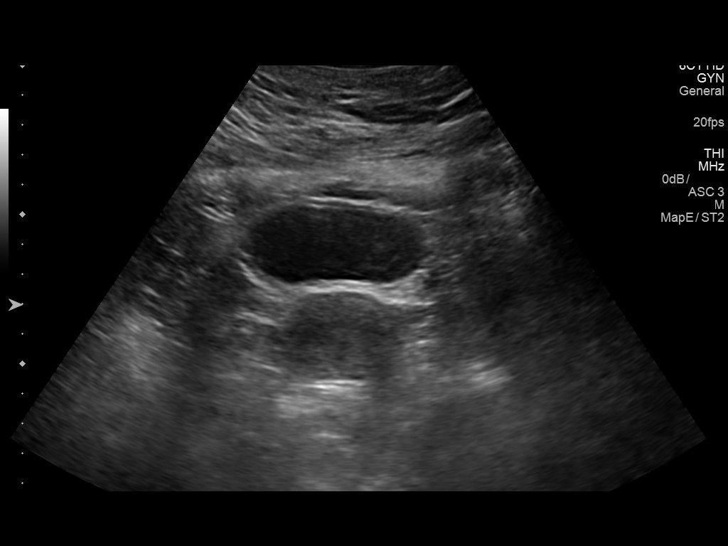
[im 17/67]
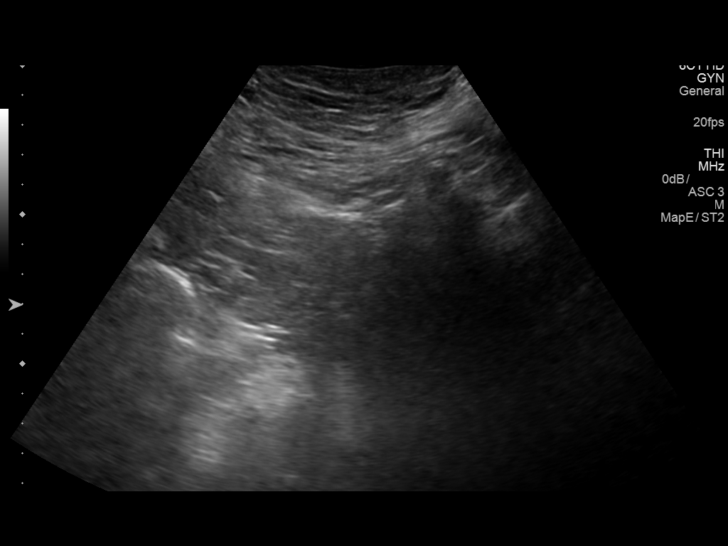
[im 23/67]
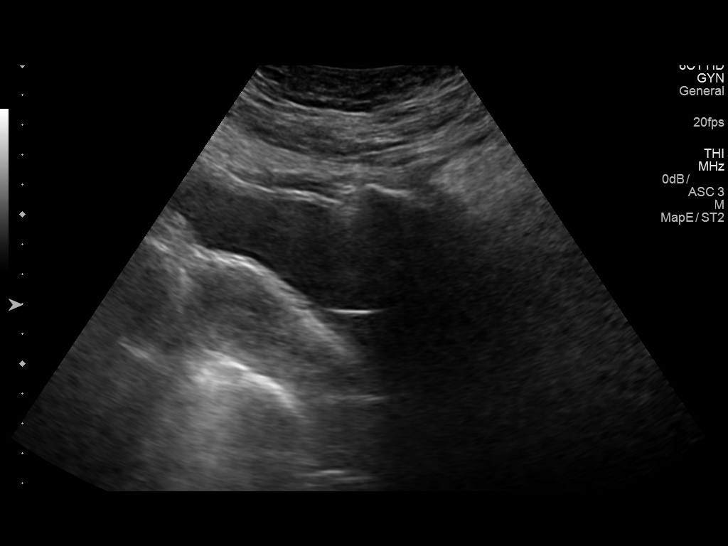
[im 28/67]
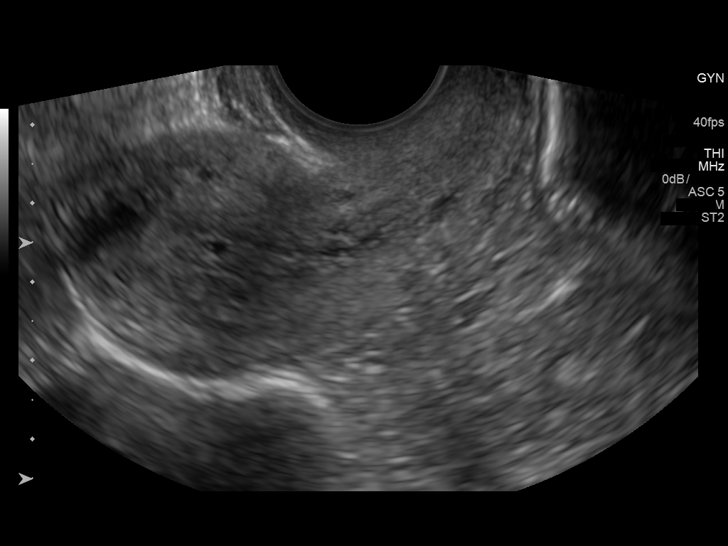
[im 34/67]
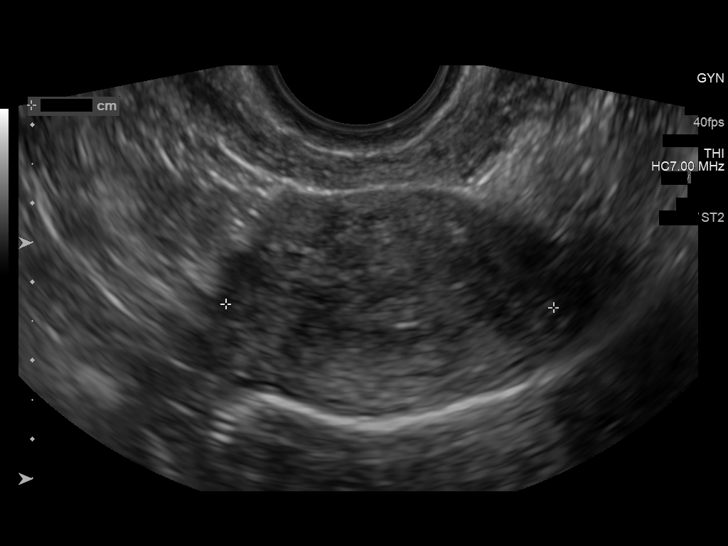
[im 39/67]
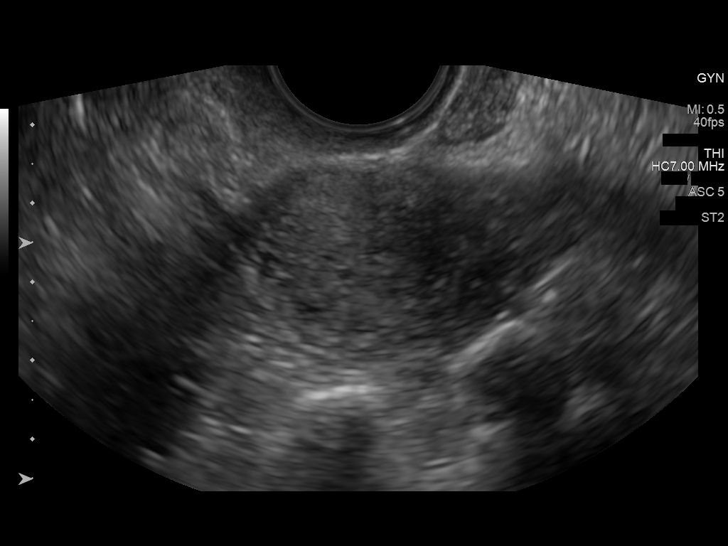
[im 45/67]
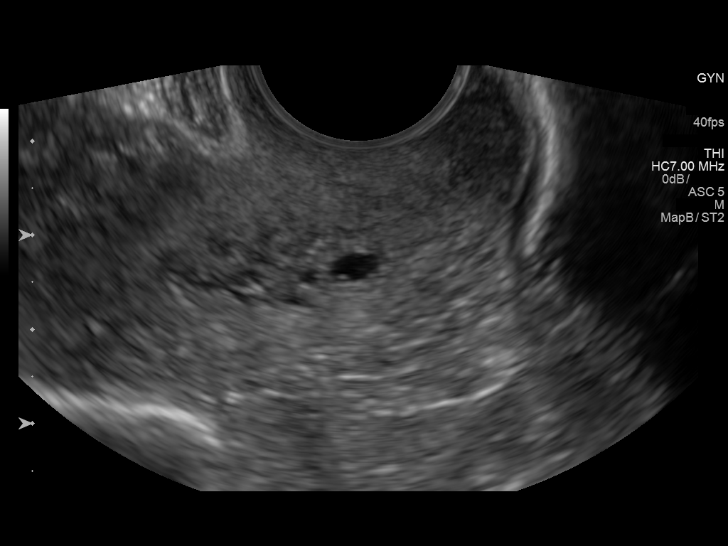
[im 50/67]
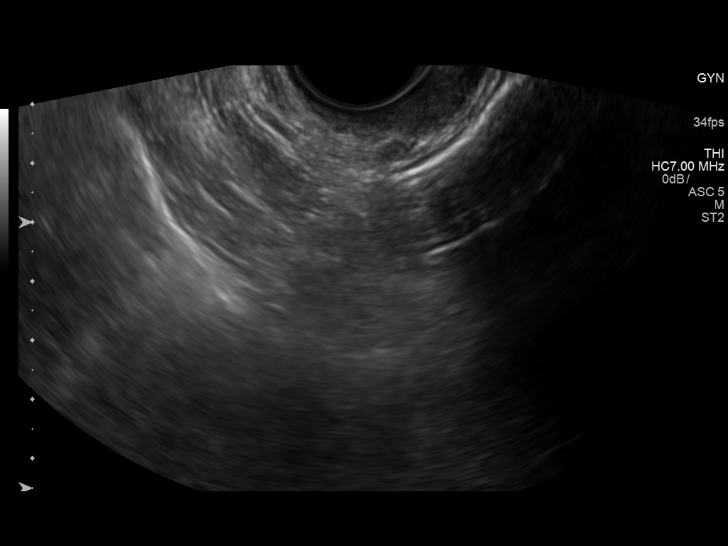
[im 56/67]
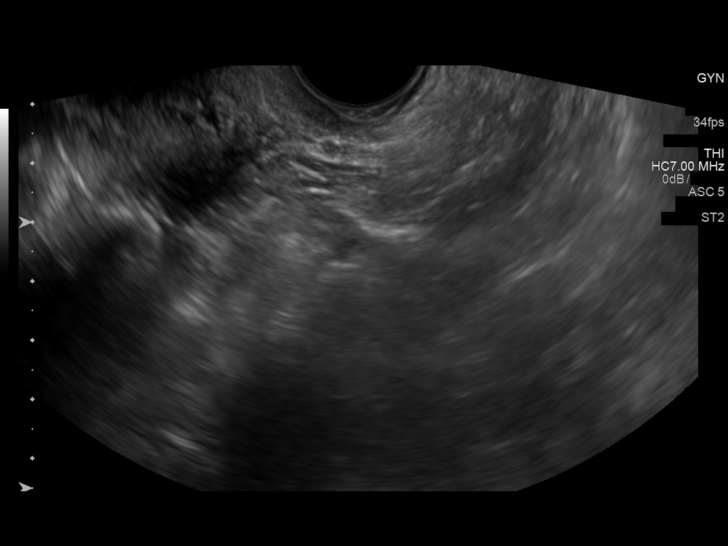
[im 61/67]
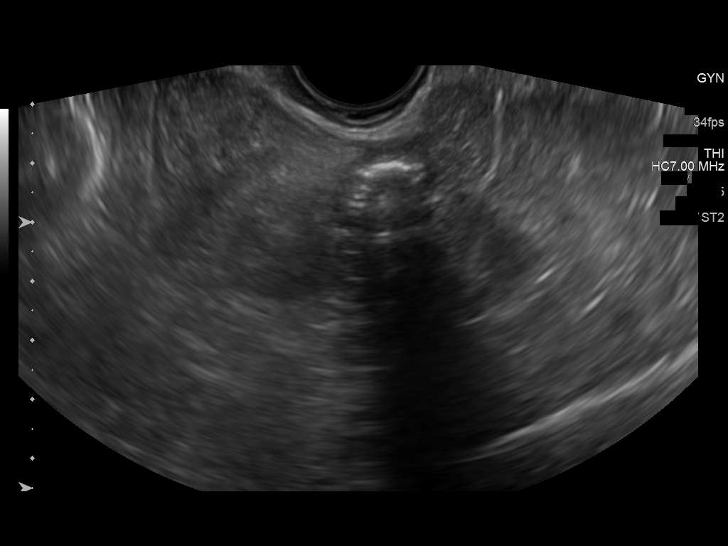
[im 67/67]
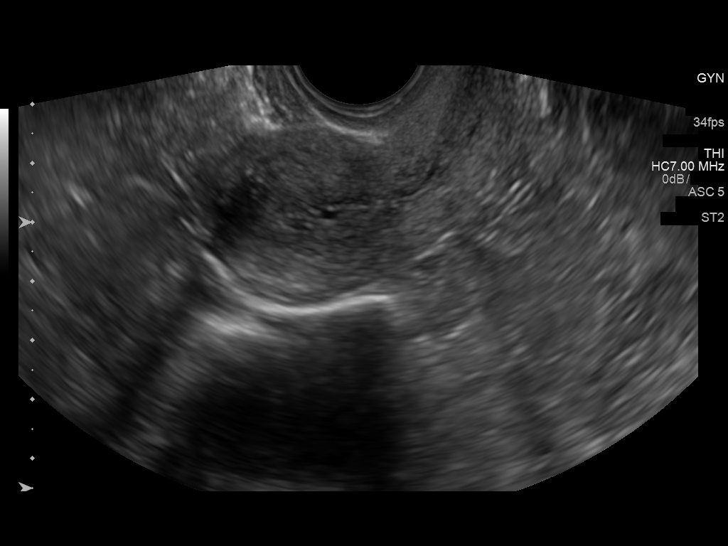

[13 of 25 positions shown; findings below may reference images not displayed]

FINDINGS: Uterus

Measurements: 6.0 x 3.1 x 4.2 cm. No discrete myometrial masses are
observed.

Endometrium

Thickness: 7.2 mm.. There is a small amount of fluid within the
endometrial cavity.

Right ovary

Measurements: The right ovary could not be visualized. No right
adnexal masses are observed.

Left ovary

The left ovary is surgically absent. No left adnexal masses are
observed.

Other findings

No abnormal free fluid.
IMPRESSION: 1. The myometrium exhibits no evidence of fibroids. There is a small
amount of fluid in the endometrial cavity but the endometrium is not
thickened. In the setting of post-menopausal bleeding, this is
consistent with a benign etiology such as endometrial atrophy. If
bleeding remains unresponsive to hormonal or medical therapy,
sonohysterogram should be considered for focal lesion work-up. (Ref:
Radiological Reasoning: Algorithmic Workup of Abnormal Vaginal
Bleeding with Endovaginal Sonography and Sonohysterography. AJR
[id number]; 191:S68-73)
2. Nonvisualization of the right ovary. The left ovary surgically
absent. No adnexal masses are observed. There is no free pelvic
fluid.

## 2021-07-30 ENCOUNTER — Emergency Department (HOSPITAL_BASED_OUTPATIENT_CLINIC_OR_DEPARTMENT_OTHER)
Admission: EM | Admit: 2021-07-30 | Discharge: 2021-07-30 | Disposition: A | Discharge status: Home / Self Care | Payer: Medicare Other | Attending: Emergency Medicine | Admitting: Emergency Medicine

## 2021-07-30 ENCOUNTER — Encounter (HOSPITAL_BASED_OUTPATIENT_CLINIC_OR_DEPARTMENT_OTHER): Payer: Self-pay

## 2021-07-30 ENCOUNTER — Other Ambulatory Visit: Payer: Self-pay

## 2021-07-30 DIAGNOSIS — E039 Hypothyroidism, unspecified: Secondary | ICD-10-CM | POA: Diagnosis not present

## 2021-07-30 DIAGNOSIS — Z79899 Other long term (current) drug therapy: Secondary | ICD-10-CM | POA: Diagnosis not present

## 2021-07-30 DIAGNOSIS — J45909 Unspecified asthma, uncomplicated: Secondary | ICD-10-CM | POA: Insufficient documentation

## 2021-07-30 DIAGNOSIS — J449 Chronic obstructive pulmonary disease, unspecified: Secondary | ICD-10-CM | POA: Insufficient documentation

## 2021-07-30 DIAGNOSIS — E119 Type 2 diabetes mellitus without complications: Secondary | ICD-10-CM | POA: Insufficient documentation

## 2021-07-30 DIAGNOSIS — I10 Essential (primary) hypertension: Secondary | ICD-10-CM | POA: Diagnosis not present

## 2021-07-30 DIAGNOSIS — Z87891 Personal history of nicotine dependence: Secondary | ICD-10-CM | POA: Diagnosis not present

## 2021-07-30 DIAGNOSIS — R200 Anesthesia of skin: Secondary | ICD-10-CM | POA: Diagnosis not present

## 2021-07-30 DIAGNOSIS — M545 Low back pain, unspecified: Secondary | ICD-10-CM | POA: Diagnosis present

## 2021-07-30 DIAGNOSIS — G8929 Other chronic pain: Secondary | ICD-10-CM | POA: Diagnosis not present

## 2021-07-30 MED ORDER — HYDROMORPHONE HCL 1 MG/ML IJ SOLN
1.0000 mg | Freq: Once | INTRAMUSCULAR | Status: AC
  Administered 2021-07-30: 1 mg via INTRAMUSCULAR
  Filled 2021-07-30: qty 1

## 2021-07-30 NOTE — TOC CM/SW Note (Signed)
TOC CM received referral to assist with [place] Medicaid PCS form. Form can be completed by pt's PCP, will fax form to Dr Lindwood Qua office. Pt states she need assistance at home with ADL's. Updated attending. Pt will need to follow up with PCP's office.  [place], Heart Failure TOC CM [phone]

## 2021-07-30 NOTE — Discharge Instructions (Addendum)
You were evaluated in the emergency department today for lower back pain.  We gave you 1 dose of pain medication here.  I do not see a reason for imaging today. I would like you to follow-up with your primary doctor for control of your chronic pain.  As we discussed if you are not happy with the care you are receiving, you are well within your rights to find a new provider.  You can continue taking your normal medications.  In terms of home health aide, I have contacted our case manager who will fax over the paperwork to your primary care provider to fill out.

## 2021-07-30 NOTE — ED Notes (Signed)
Pt requesting assistance with transportation home. [facility] called, no answer.  Will call again in a few minutes.

## 2021-07-30 NOTE — ED Provider Notes (Signed)
[facility] DEPART[facility] Provider Note   CSN: [id number] Arrival date & time: 07/30/21  1019     History Chief Complaint  Patient presents with   Back Pain    Martha Schwartz is a 60 y.o. female with history of HTN, GERD, T2DM, COPD and chronic pain disorder who presents with back pain x 2 weeks, worsening in the past 2 days. She states the pain is worse with any movement. Her pain is worse in her lower back on both sides.  No injuries or trauma to the area.  She is chronically on ibuprofen, oxycodone, gabapentin, and robaxin. States she has been taking medications normally without relief, has not taken meds today. Patient states she has been constipated recently, last BM this morning. Reports chronic numbness of her right lateral thigh, no changes. No new numbness or tingling. No fevers, chills, abdominal pain, dysuria, saddle anesthesia, loss of bladder or bowel function.    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   Crack cocaine use    last use per pt 3 years ago   Depressed    Diabetes mellitus without complication ([place])    GERD (gastroesophageal reflux disease)    Hypertension    Thyroid disease     Patient Active Problem List   Diagnosis Date Noted   Postmenopausal bleeding 03/31/2016   Diabetes mellitus due to underlying condition, controlled, with skin complication, without long-term current use of insulin ([place]) 10/19/2015   Ovarian mass 10/07/2015   Pelvic mass in female 10/07/2015   GERD (gastroesophageal reflux disease) 06/22/2013   Chronic pain 06/22/2013   History of ETOH abuse 06/22/2013   History of narcotic use 06/22/2013   Morbid obesity ([place]) 06/22/2013    Tachycardia 06/22/2013   HTN (hypertension) 06/22/2013   Hypothyroidism 06/22/2013    Past Surgical History:  Procedure Laterality Date   BACK SURGERY     EYE SURGERY     bilateral cataract surgery with lens implant   KNEE SURGERY     LAPAROTOMY N/A 12/03/2015   Procedure: EXPLORATORY LAPAROTOMY ;  Surgeon: Everitt Amber, MD;  Location: [facility];  Service: Gynecology;  Laterality: N/A;   SALPINGOOPHORECTOMY Left 12/03/2015   Procedure: LEFT SALPINGO OOPHORECTOMY;  Surgeon: Everitt Amber, MD;  Location: [facility];  Service: Gynecology;  Laterality: Left;   scalp surgery     stitches in head    shoulder replacements     left x 2     OB History     Gravida  1   Para      Term      Preterm      AB  1   Living         SAB      IAB  1   Ectopic      Multiple      Live Births              No family history on file.  Social History   Tobacco Use   Smoking status: Former    Types: Cigarettes   Smokeless tobacco: Current    Types: Snuff  Vaping  Use   Vaping Use: Never used  Substance Use Topics   Alcohol use: No   Drug use: Yes    Types: Marijuana    Home Medications Prior to Admission medications   Medication Sig Start Date End Date Taking? Authorizing Provider  albuterol (PROVENTIL HFA;VENTOLIN HFA) 108 (90 BASE) MCG/ACT inhaler Inhale 2 puffs into the lungs every 6 (six) hours as needed for wheezing or shortness of breath.    [provider]  ALPRAZolam Duanne Moron) 1 MG tablet Take 1 mg by mouth at bedtime as needed for sleep.    [provider]  amLODipine (NORVASC) 10 MG tablet Take 10 mg by mouth daily.    [provider]  docusate sodium (COLACE) 100 MG capsule Take 100 mg by mouth 2 (two) times daily.    [provider]  DULoxetine (CYMBALTA) 60 MG capsule Take 120 mg by mouth daily.    [provider]  gabapentin (NEURONTIN) 600 MG tablet Take 600 mg by mouth 3 (three) times daily.    [provider]   hydrocortisone cream 1 % Apply 1 application topically daily.    [provider]  ibuprofen (ADVIL,MOTRIN) 800 MG tablet Take 1 tablet (800 mg total) by mouth 3 (three) times daily as needed for fever, headache or moderate pain. 10/11/15   Anyanwu, Sallyanne Havers, MD  levothyroxine (SYNTHROID, LEVOTHROID) 50 MCG tablet Take 50 mcg by mouth daily before breakfast.    [provider]  lisinopril-hydrochlorothiazide (PRINZIDE,ZESTORETIC) 20-25 MG per tablet Take 1 tablet by mouth daily.    [provider]  lubiprostone (AMITIZA) 24 MCG capsule Take 24 mcg by mouth 2 (two) times daily with a meal.    [provider]  meloxicam (MOBIC) 15 MG tablet Take 15 mg by mouth daily.    [provider]  metFORMIN (GLUCOPHAGE) 500 MG tablet Take 500 mg by mouth 2 (two) times daily with a meal.    [provider]  methocarbamol (ROBAXIN) 750 MG tablet Take 750 mg by mouth 3 (three) times daily.     [provider]  metoprolol succinate (TOPROL-XL) 25 MG 24 hr tablet Take 1 tablet (25 mg total) by mouth daily. *Patient needs appointment for further refills*    Hilty, Nadean Corwin, MD  mirtazapine (REMERON) 15 MG tablet Take 7.5-15 mg by mouth at bedtime as needed (For sleep.).    [provider]  omeprazole (PRILOSEC) 40 MG capsule Take 40 mg by mouth daily.    [provider]  oxybutynin (DITROPAN-XL) 5 MG 24 hr tablet Take 5 mg by mouth at bedtime.    [provider]  oxyCODONE (ROXICODONE) 15 MG immediate release tablet Take 1 tablet (15 mg total) by mouth 4 (four) times daily as needed for pain. 12/16/15   Everitt Amber, MD  potassium chloride SA (K-DUR,KLOR-CON) 20 MEQ tablet Take 40 mEq by mouth daily.     [provider]  simvastatin (ZOCOR) 20 MG tablet Take 20 mg by mouth daily.     [provider]    Allergies    Sulfa antibiotics  Review of Systems   Review of Systems  Constitutional:  Negative for chills  and fever.  Gastrointestinal:  Positive for constipation. Negative for abdominal pain, diarrhea, nausea and vomiting.  Genitourinary:  Negative for dysuria and frequency.  Musculoskeletal:  Positive for back pain.  Neurological:  Negative for weakness and numbness.  All other systems reviewed and are negative.  Physical Exam Updated Vital  Signs BP 100/68 (BP Location: Right Arm)   Pulse 96   Temp 98.3 F (36.8 C) (Oral)   Resp 19 Comment: Simultaneous filing. User may not have seen previous data.  Ht 5\' 7"  (1.702 m)   Wt (!) 144.7 kg   SpO2 92%   BMI 49.96 kg/m   Physical Exam Vitals and nursing note reviewed.  Constitutional:      Appearance: Normal appearance.  HENT:     Head: Normocephalic and atraumatic.  Eyes:     Conjunctiva/sclera: Conjunctivae normal.  Pulmonary:     Effort: Pulmonary effort is normal. No respiratory distress.  Musculoskeletal:     Comments: No tenderness to palpation of midline spine or lumbar musculature Str 5/5 bilateral lower extremities. Pulses normal in BLE.   Skin:    General: Skin is warm and dry.  Neurological:     Mental Status: She is alert.     Comments: Neuro: Sensation intact throughout.   Psychiatric:        Mood and Affect: Mood normal.        Behavior: Behavior normal.    ED Results / Procedures / Treatments   Labs (all labs ordered are listed, but only abnormal results are displayed) Labs Reviewed - No data to display  EKG None  Radiology No results found.  Procedures Procedures   Medications Ordered in ED Medications  HYDROmorphone (DILAUDID) injection 1 mg (1 mg Intramuscular Given 07/30/21 1209)    ED Course  I have reviewed the triage vital signs and the nursing notes.  Pertinent labs & imaging results that were available during my care of the patient were reviewed by me and considered in my medical decision making (see chart for details).    MDM Rules/Calculators/A&P                           Patient  is a 60 year old female with history of chronic pain who presents with lower back pain for the past 2 weeks, worsening in the last 2 days.  No injuries or trauma to the area.  History of chronic numbness on her right lateral thigh.  No new numbness, tingling, saddle anesthesia, loss of bladder or bowel function. On exam she has generalized low back pain, no tenderness to palpation of midline spine. No overlying skin changes. She is neurovascularly in tact in all extremities.   Patient's symptoms seem related to her chronic pain. I do not have concern for acute changes due to relatively benign exam and history of similar pain with no new aggravating factors. I do not think imaging is necessary today. Plan to give one dose of pain medication.  On reevaluation, patient states her pain is mildly improved.  Discussed with patient need for PCP follow up and pain clinic.  Contacted case management, who will send over paperwork to her primary care provider for home health aide per patient request.  Discussed patient treatment with attending physician Dr. Johnney Killian who agrees with above plan.   Final Clinical Impression(s) / ED Diagnoses Final diagnoses:  Acute bilateral low back pain without sciatica    Rx / DC Orders ED Discharge Orders     None        Estill Cotta 07/30/21 1300    Charlesetta Shanks, MD 07/31/21 [phone]

## 2021-07-30 NOTE — ED Notes (Signed)
Pt assisted to wheelchair by NT, wheeled to ED entrance to meet ride by NT.

## 2021-07-30 NOTE — ED Triage Notes (Signed)
Pt arrives via [facility] from home with c/o back pain X2 days [facility] reports VSS 172 CBG. Pt also c/o hemorrhoids r/t chronic pain medication

## 2021-07-30 NOTE — ED Notes (Signed)
Pt assisted to use house phone to call sister.

## 2021-07-30 NOTE — ED Notes (Signed)
[employer] [facility] contacted for pt transport. Cab dispatch reports they will be here in appx 30 mins.  Pt made aware.

## 2021-09-16 LAB — EXTERNAL GENERIC LAB PROCEDURE: COLOGUARD: NEGATIVE

## 2021-09-16 LAB — COLOGUARD: COLOGUARD: NEGATIVE

## 2023-10-27 ENCOUNTER — Ambulatory Visit: Payer: 59 | Admitting: Internal Medicine

## 2023-10-27 NOTE — Progress Notes (Deleted)
Name: Martha Schwartz  MRN/ DOB: [id number], 1961-01-23   Age/ Sex: 62 y.o., female    PCP: Karle Plumber, MD   Reason for Endocrinology Evaluation: Type {NUMBERS 1 OR 2:522190} Diabetes Mellitus     Date of Initial Endocrinology Visit: 10/27/2023     PATIENT IDENTIFIER: Martha Schwartz is a 62 y.o. female with a past medical history of ***. The patient presented for initial endocrinology clinic visit on 10/27/2023 for consultative assistance with her diabetes management.    HPI: Martha Schwartz was    Diagnosed with DM *** Prior Medications tried/Intolerance: *** Currently checking blood sugars *** x / day,  before breakfast and ***.  Hypoglycemia episodes : ***               Symptoms: ***                 Frequency: ***/  Hemoglobin A1c has ranged from *** in ***, peaking at *** in ***. Patient required assistance for hypoglycemia:  Patient has required hospitalization within the last 1 year from hyper or hypoglycemia:   In terms of diet, the patient ***   HOME DIABETES REGIMEN: Basal: ***  Bolus: ***   Statin: {Yes/No:11203} ACE-I/ARB: {YES/NO:17245} Prior Diabetic Education: {Yes/No:11203}   METER DOWNLOAD SUMMARY: Date range evaluated: *** Fingerstick Blood Glucose Tests = *** Average Number Tests/Day = *** Overall Mean FS Glucose = *** Standard Deviation = ***  BG Ranges: Low = *** High = ***   Hypoglycemic Events/30 Days: BG < 50 = *** Episodes of symptomatic severe hypoglycemia = ***   DIABETIC COMPLICATIONS: Microvascular complications:  *** Denies: *** Last eye exam: Completed   Macrovascular complications:  *** Denies: CAD, PVD, CVA   PAST HISTORY: Past Medical History:  Past Medical History:  Diagnosis Date   Anxiety    Asthma    Chronic pain disorder    on oxycodone 15 mg 3-4 times a day   COPD (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   Crack cocaine use    last use per pt 3 years  ago   Depressed    Diabetes mellitus without complication (HCC)    GERD (gastroesophageal reflux disease)    Hypertension    Thyroid disease    Past Surgical History:  Past Surgical History:  Procedure Laterality Date   BACK SURGERY     EYE SURGERY     bilateral cataract surgery with lens implant   KNEE SURGERY     LAPAROTOMY N/A 12/03/2015   Procedure: EXPLORATORY LAPAROTOMY ;  Surgeon: Adolphus Birchwood, MD;  Location: [facility];  Service: Gynecology;  Laterality: N/A;   SALPINGOOPHORECTOMY Left 12/03/2015   Procedure: LEFT SALPINGO OOPHORECTOMY;  Surgeon: Adolphus Birchwood, MD;  Location: [facility];  Service: Gynecology;  Laterality: Left;   scalp surgery     stitches in head    shoulder replacements     left x 2    Social History:  reports that she has quit smoking. Her smoking use included cigarettes. Her smokeless tobacco use includes snuff. She reports current drug use. Drug: Marijuana. She reports that she does not drink alcohol. Family History: No family history on file.   HOME MEDICATIONS: Allergies as of 10/27/2023       Reactions   Sulfa Antibiotics Rash        Medication List        Accurate as of October 27, 2023  1:16 PM. If  you have any questions, ask your nurse or doctor.          albuterol 108 (90 Base) MCG/ACT inhaler Commonly known as: VENTOLIN HFA Inhale 2 puffs into the lungs every 6 (six) hours as needed for wheezing or shortness of breath.   ALPRAZolam 1 MG tablet Commonly known as: XANAX Take 1 mg by mouth at bedtime as needed for sleep.   amLODipine 10 MG tablet Commonly known as: NORVASC Take 10 mg by mouth daily.   docusate sodium 100 MG capsule Commonly known as: COLACE Take 100 mg by mouth 2 (two) times daily.   DULoxetine 60 MG capsule Commonly known as: CYMBALTA Take 120 mg by mouth daily.   gabapentin 600 MG tablet Commonly known as: NEURONTIN Take 600 mg by mouth 3 (three) times daily.   hydrocortisone cream 1 % Apply 1 application  topically daily.   ibuprofen 800 MG tablet Commonly known as: ADVIL Take 1 tablet (800 mg total) by mouth 3 (three) times daily as needed for fever, headache or moderate pain.   levothyroxine 50 MCG tablet Commonly known as: SYNTHROID Take 50 mcg by mouth daily before breakfast.   lisinopril-hydrochlorothiazide 20-25 MG tablet Commonly known as: ZESTORETIC Take 1 tablet by mouth daily.   lubiprostone 24 MCG capsule Commonly known as: AMITIZA Take 24 mcg by mouth 2 (two) times daily with a meal.   meloxicam 15 MG tablet Commonly known as: MOBIC Take 15 mg by mouth daily.   metFORMIN 500 MG tablet Commonly known as: GLUCOPHAGE Take 500 mg by mouth 2 (two) times daily with a meal.   methocarbamol 750 MG tablet Commonly known as: ROBAXIN Take 750 mg by mouth 3 (three) times daily.   metoprolol succinate 25 MG 24 hr tablet Commonly known as: TOPROL-XL Take 1 tablet (25 mg total) by mouth daily. *Patient needs appointment for further refills*   mirtazapine 15 MG tablet Commonly known as: REMERON Take 7.5-15 mg by mouth at bedtime as needed (For sleep.).   omeprazole 40 MG capsule Commonly known as: PRILOSEC Take 40 mg by mouth daily.   oxybutynin 5 MG 24 hr tablet Commonly known as: DITROPAN-XL Take 5 mg by mouth at bedtime.   oxyCODONE 15 MG immediate release tablet Commonly known as: ROXICODONE Take 1 tablet (15 mg total) by mouth 4 (four) times daily as needed for pain.   potassium chloride SA 20 MEQ tablet Commonly known as: KLOR-CON M Take 40 mEq by mouth daily.   simvastatin 20 MG tablet Commonly known as: ZOCOR Take 20 mg by mouth daily.         ALLERGIES: Allergies  Allergen Reactions   Sulfa Antibiotics Rash     REVIEW OF SYSTEMS: A comprehensive ROS was conducted with the patient and is negative except as per HPI and below:  ROS    OBJECTIVE:   VITAL SIGNS: There were no vitals taken for this visit.   PHYSICAL EXAM:  General: Pt  appears well and is in NAD  Neck: General: Supple without adenopathy or carotid bruits. Thyroid: Thyroid size normal.  No goiter or nodules appreciated.   Lungs: Clear with good BS bilat   Heart: RRR   Abdomen:  soft, nontender  Extremities:  Lower extremities - No pretibial edema.   Skin:  No rash noted.  Neuro: MS is good with appropriate affect, pt is alert and Ox3    DM foot exam:    DATA REVIEWED:  Lab Results  Component Value Date  HGBA1C 6.2 (H) 10/19/2015   HGBA1C 6.3 (H) 10/08/2015   Lab Results  Component Value Date   CREATININE 0.78 12/04/2015   No results found for: "MICRALBCREAT"  No results found for: "CHOL", "HDL", "LDLCALC", "LDLDIRECT", "TRIG", "CHOLHDL"      ASSESSMENT / PLAN / RECOMMENDATIONS:   1) Type *** Diabetes Mellitus, ***controlled, With*** complications - Most recent A1c of *** %. Goal A1c < *** %.  ***  Plan: GENERAL: ***  MEDICATIONS: ***  EDUCATION / INSTRUCTIONS: BG monitoring instructions: Patient is instructed to check her blood sugars *** times a day, ***. Call [facility] if: BG persistently < 70  I reviewed the Rule of 15 for the treatment of hypoglycemia in detail with the patient. Literature supplied.   2) Diabetic complications:  Eye: Does *** have known diabetic retinopathy.  Neuro/ Feet: Does *** have known diabetic peripheral neuropathy. Renal: Patient does *** have known baseline CKD. She is *** on an ACEI/ARB at present.  3) Lipids: Patient is *** on a statin.    4) Hypertension: ***  at goal of < 140/90 mmHg.       Signed electronically by: Lyndle Herrlich, MD  [facility]  [facility] [address] [place], [place] [address] Phone: [phone] FAX: [phone]   CC: Karle Plumber, MD [phone] [facility] [place] [address] Phone: [phone]  Fax: [phone]    Return to Endocrinology clinic as below: Future Appointments  Date  Time Provider Department Center  10/27/2023  2:00 PM Esdras Delair, Konrad Dolores, MD [facility] None

## 2023-12-14 ENCOUNTER — Other Ambulatory Visit (HOSPITAL_COMMUNITY): Payer: Self-pay

## 2023-12-15 ENCOUNTER — Other Ambulatory Visit (HOSPITAL_COMMUNITY): Payer: Self-pay

## 2023-12-15 MED ORDER — OXYCODONE HCL 15 MG PO TABS
15.0000 mg | ORAL_TABLET | Freq: Three times a day (TID) | ORAL | 0 refills | Status: AC | PRN
  Filled 2023-12-15: qty 90, 30d supply, fill #0

## 2024-01-15 ENCOUNTER — Other Ambulatory Visit (HOSPITAL_COMMUNITY): Payer: Self-pay

## 2024-01-19 ENCOUNTER — Ambulatory Visit: Payer: 59 | Admitting: Internal Medicine

## 2024-04-17 ENCOUNTER — Ambulatory Visit: Admitting: Internal Medicine

## 2024-06-05 ENCOUNTER — Ambulatory Visit: Admitting: Internal Medicine

## 2024-06-05 NOTE — Progress Notes (Deleted)
 Name: Martha Schwartz  MRN/ DOB: [id number], Jun 06, 1961   Age/ Sex: 63 y.o., female    PCP: Dorena Fernando HERO, MD   Reason for Endocrinology Evaluation: Type {NUMBERS 1 OR 2:522190} Diabetes Mellitus     Date of Initial Endocrinology Visit: 06/05/2024     PATIENT IDENTIFIER: Ms. Martha Schwartz is a 63 y.o. female with a past medical history of ***. The patient presented for initial endocrinology clinic visit on 06/05/2024 for consultative assistance with her diabetes management.    HPI: Martha Schwartz was    Diagnosed with DM *** Prior Medications tried/Intolerance: *** Currently checking blood sugars *** x / day,  before breakfast and ***.  Hypoglycemia episodes : ***               Symptoms: ***                 Frequency: ***/  Hemoglobin A1c has ranged from 5.6% in 2023, peaking at *** in ***.  In terms of diet, the patient ***   HOME DIABETES REGIMEN: Basal: ***  Bolus: ***   Statin: {Yes/No:11203} ACE-I/ARB: {YES/NO:17245} Prior Diabetic Education: {Yes/No:11203}   METER DOWNLOAD SUMMARY: Date range evaluated: *** Fingerstick Blood Glucose Tests = *** Average Number Tests/Day = *** Overall Mean FS Glucose = *** Standard Deviation = ***  BG Ranges: Low = *** High = ***   Hypoglycemic Events/30 Days: BG < 50 = *** Episodes of symptomatic severe hypoglycemia = ***   DIABETIC COMPLICATIONS: Microvascular complications:  *** Denies: *** Last eye exam: Completed   Macrovascular complications:  *** Denies: CAD, PVD, CVA   PAST HISTORY: Past Medical History:  Past Medical History:  Diagnosis Date   Anxiety    Asthma    Chronic pain disorder    on oxycodone  15 mg 3-4 times a day   COPD (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   Crack cocaine use    last use per pt 3 years ago   Depressed    Diabetes mellitus without complication (HCC)    GERD (gastroesophageal reflux disease)    Hypertension    Thyroid  disease    Past Surgical History:  Past Surgical History:  Procedure Laterality Date   BACK SURGERY     EYE SURGERY     bilateral cataract surgery with lens implant   KNEE SURGERY     LAPAROTOMY N/A 12/03/2015   Procedure: EXPLORATORY LAPAROTOMY ;  Surgeon: Maurilio Ship, MD;  Location: [facility];  Service: Gynecology;  Laterality: N/A;   SALPINGOOPHORECTOMY Left 12/03/2015   Procedure: LEFT SALPINGO OOPHORECTOMY;  Surgeon: Maurilio Ship, MD;  Location: [facility];  Service: Gynecology;  Laterality: Left;   scalp surgery     stitches in head    shoulder replacements     left x 2    Social History:  reports that she has quit smoking. Her smoking use included cigarettes. Her smokeless tobacco use includes snuff. She reports current drug use. Drug: Marijuana. She reports that she does not drink alcohol . Family History: No family history on file.   HOME MEDICATIONS: Allergies as of 06/05/2024       Reactions   Sulfa Antibiotics Rash        Medication List        Accurate as of June 05, 2024  1:02 PM. If you have any questions, ask your nurse or doctor.          albuterol  108 (  90 Base) MCG/ACT inhaler Commonly known as: VENTOLIN  HFA Inhale 2 puffs into the lungs every 6 (six) hours as needed for wheezing or shortness of breath.   ALPRAZolam  1 MG tablet Commonly known as: XANAX  Take 1 mg by mouth at bedtime as needed for sleep.   amLODipine  10 MG tablet Commonly known as: NORVASC  Take 10 mg by mouth daily.   docusate sodium  100 MG capsule Commonly known as: COLACE Take 100 mg by mouth 2 (two) times daily.   DULoxetine  60 MG capsule Commonly known as: CYMBALTA  Take 120 mg by mouth daily.   gabapentin  600 MG tablet Commonly known as: NEURONTIN  Take 600 mg by mouth 3 (three) times daily.   hydrocortisone cream 1 % Apply 1 application topically daily.   ibuprofen  800 MG tablet Commonly known as: ADVIL  Take 1 tablet (800 mg total) by mouth 3 (three) times daily as needed  for fever, headache or moderate pain.   levothyroxine  50 MCG tablet Commonly known as: SYNTHROID  Take 50 mcg by mouth daily before breakfast.   lisinopril -hydrochlorothiazide  20-25 MG tablet Commonly known as: ZESTORETIC  Take 1 tablet by mouth daily.   lubiprostone  24 MCG capsule Commonly known as: AMITIZA  Take 24 mcg by mouth 2 (two) times daily with a meal.   meloxicam  15 MG tablet Commonly known as: MOBIC  Take 15 mg by mouth daily.   metFORMIN  500 MG tablet Commonly known as: GLUCOPHAGE  Take 500 mg by mouth 2 (two) times daily with a meal.   methocarbamol  750 MG tablet Commonly known as: ROBAXIN  Take 750 mg by mouth 3 (three) times daily.   metoprolol  succinate 25 MG 24 hr tablet Commonly known as: TOPROL -XL Take 1 tablet (25 mg total) by mouth daily. *Patient needs appointment for further refills*   mirtazapine  15 MG tablet Commonly known as: REMERON  Take 7.5-15 mg by mouth at bedtime as needed (For sleep.).   omeprazole 40 MG capsule Commonly known as: PRILOSEC Take 40 mg by mouth daily.   oxybutynin  5 MG 24 hr tablet Commonly known as: DITROPAN -XL Take 5 mg by mouth at bedtime.   oxyCODONE  15 MG immediate release tablet Commonly known as: ROXICODONE  Take 1 tablet (15 mg total) by mouth 4 (four) times daily as needed for pain.   oxyCODONE  15 MG immediate release tablet Commonly known as: ROXICODONE  Take 1 tablet (15 mg total) by mouth 3 (three) times daily as needed.   potassium chloride  SA 20 MEQ tablet Commonly known as: KLOR-CON  M Take 40 mEq by mouth daily.   simvastatin  20 MG tablet Commonly known as: ZOCOR  Take 20 mg by mouth daily.         ALLERGIES: Allergies  Allergen Reactions   Sulfa Antibiotics Rash     REVIEW OF SYSTEMS: A comprehensive ROS was conducted with the patient and is negative except as per HPI and below:  ROS    OBJECTIVE:   VITAL SIGNS: There were no vitals taken for this visit.   PHYSICAL EXAM:  General: Pt  appears well and is in NAD  Neck: General: Supple without adenopathy or carotid bruits. Thyroid: Thyroid size normal.  No goiter or nodules appreciated.   Lungs: Clear with good BS bilat   Heart: RRR   Abdomen:  soft, nontender  Extremities:  Lower extremities - No pretibial edema.   Skin:  No rash noted.  Neuro: MS is good with appropriate affect, pt is alert and Ox3    DM foot exam:    DATA REVIEWED:  Lab  Results  Component Value Date   HGBA1C 6.2 (H) 10/19/2015   HGBA1C 6.3 (H) 10/08/2015   Lab Results  Component Value Date   CREATININE 0.78 12/04/2015   No results found for: MICRALBCREAT  No results found for: CHOL, HDL, LDLCALC, LDLDIRECT, TRIG, CHOLHDL      ASSESSMENT / PLAN / RECOMMENDATIONS:   1) Type *** Diabetes Mellitus, ***controlled, With*** complications - Most recent A1c of *** %. Goal A1c < *** %.  ***  Plan: GENERAL: ***  MEDICATIONS: ***  EDUCATION / INSTRUCTIONS: BG monitoring instructions: Patient is instructed to check her blood sugars *** times a day, ***. Call [place] [facility] if: BG persistently < 70  I reviewed the Rule of 15 for the treatment of hypoglycemia in detail with the patient. Literature supplied.   2) Diabetic complications:  Eye: Does *** have known diabetic retinopathy.  Neuro/ Feet: Does *** have known diabetic peripheral neuropathy. Renal: Patient does *** have known baseline CKD. She is *** on an ACEI/ARB at present.  3) Lipids: Patient is *** on a statin.    4) Hypertension: ***  at goal of < 140/90 mmHg.       Signed electronically by: Stefano Redgie Butts, MD  [facility]  [facility] [address] [place], [place] [address] Phone: [phone] FAX: [phone]   CC: Dorena Fernando HERO, MD [phone] [facility] [address] [place] [address] Phone: [phone]  Fax: [phone]    Return to Endocrinology clinic as below: Future Appointments  Date  Time Provider Department Center  06/05/2024  2:00 PM Dillan Lunden, Donell Redgie, MD [facility] None
